# Patient Record
Sex: Male | Born: 1935
Health system: Southern US, Community
[De-identification: ages and names within clinical notes are randomized; demographics above are authoritative.]

## PROBLEM LIST (undated history)

## (undated) DIAGNOSIS — N390 Urinary tract infection, site not specified: Secondary | ICD-10-CM

## (undated) DIAGNOSIS — Z8669 Personal history of other diseases of the nervous system and sense organs: Secondary | ICD-10-CM

## (undated) DIAGNOSIS — Z87442 Personal history of urinary calculi: Secondary | ICD-10-CM

## (undated) DIAGNOSIS — N200 Calculus of kidney: Secondary | ICD-10-CM

## (undated) DIAGNOSIS — G510 Bell's palsy: Secondary | ICD-10-CM

## (undated) HISTORY — DX: Urinary tract infection, site not specified: N39.0

## (undated) HISTORY — DX: Personal history of urinary calculi: Z87.442

## (undated) HISTORY — PX: TONSILLECTOMY: SUR1361

## (undated) HISTORY — DX: Personal history of other diseases of the nervous system and sense organs: Z86.69

---

## 2003-06-15 ENCOUNTER — Emergency Department (HOSPITAL_COMMUNITY): Admission: AD | Admit: 2003-06-15 | Discharge: 2003-06-15 | Payer: Self-pay | Admitting: Family Medicine

## 2004-06-08 ENCOUNTER — Emergency Department (HOSPITAL_COMMUNITY): Admission: EM | Admit: 2004-06-08 | Discharge: 2004-06-09 | Payer: Self-pay | Admitting: Emergency Medicine

## 2014-11-22 ENCOUNTER — Encounter (HOSPITAL_COMMUNITY): Payer: Self-pay

## 2014-11-22 ENCOUNTER — Emergency Department (HOSPITAL_COMMUNITY)
Admission: EM | Admit: 2014-11-22 | Discharge: 2014-11-22 | Disposition: A | Discharge status: Home / Self Care | Payer: Medicare Other | Attending: Emergency Medicine | Admitting: Emergency Medicine

## 2014-11-22 DIAGNOSIS — Y9289 Other specified places as the place of occurrence of the external cause: Secondary | ICD-10-CM | POA: Diagnosis not present

## 2014-11-22 DIAGNOSIS — Y9389 Activity, other specified: Secondary | ICD-10-CM | POA: Insufficient documentation

## 2014-11-22 DIAGNOSIS — Y998 Other external cause status: Secondary | ICD-10-CM | POA: Insufficient documentation

## 2014-11-22 DIAGNOSIS — W57XXXA Bitten or stung by nonvenomous insect and other nonvenomous arthropods, initial encounter: Secondary | ICD-10-CM | POA: Insufficient documentation

## 2014-11-22 DIAGNOSIS — S40869A Insect bite (nonvenomous) of unspecified upper arm, initial encounter: Secondary | ICD-10-CM | POA: Diagnosis not present

## 2014-11-22 DIAGNOSIS — S40861A Insect bite (nonvenomous) of right upper arm, initial encounter: Secondary | ICD-10-CM | POA: Diagnosis not present

## 2014-11-22 MED ORDER — DOXYCYCLINE HYCLATE 100 MG PO TABS
200.0000 mg | ORAL_TABLET | Freq: Once | ORAL | Status: AC
  Administered 2014-11-22: 200 mg via ORAL
  Filled 2014-11-22: qty 2

## 2014-11-22 NOTE — ED Notes (Signed)
Pt bit in left AC by tick on Saturday, denies any symptoms regarding tick bite just has swelling and redness at site and needs to be checked.

## 2014-11-22 NOTE — ED Notes (Signed)
Pt reports he removed the Tick on Friday skin surrounding the bite site is red and swollen.

## 2014-11-22 NOTE — ED Provider Notes (Signed)
CSN: [id number]     Arrival date & time 11/22/14  1107 History  This chart was scribed for non-physician practitioner, Joycie PeekBenjamin Tiea Manninen, PA-C, working with Zadie Rhineonald Wickline, MD by Charline BillsEssence Howell, ED Scribe. This patient was seen in room TR07C/TR07C and the patient's care was started at 11:32 AM.   Chief Complaint  Patient presents with  . Tick Removal   The history is provided by the patient. No language interpreter was used.   HPI Comments: David [name] is a 79 y.o. male, with no pertinent medical history, who presents to the Emergency Department complaining of a tick bite to his R arm that occurred 3 days ago. Pt was able to remove the small tick that he suspects was not on him a full day. Reports tick was small and did not appear engorged. He reports surrounding redness, itching and swelling to his R arm. He denies associated pain, fever, body aches, chest pain, SOB, nausea, vomiting, rash. Pt has been treating with topical Benadryl with temporary relief. No other alleviating or aggravating factors. No known allergies.   History reviewed. No pertinent past medical history. History reviewed. No pertinent past surgical history. History reviewed. No pertinent family history. History  Substance Use Topics  . Smoking status: Never Smoker   . Smokeless tobacco: Not on file  . Alcohol Use: No    Review of Systems  Constitutional: Negative for fever.  Respiratory: Negative for shortness of breath.   Cardiovascular: Negative for chest pain.  Gastrointestinal: Negative for nausea and vomiting.  Musculoskeletal: Negative for myalgias.  Skin: Positive for color change.       + Bite   Allergies  Review of patient's allergies indicates no known allergies.  Home Medications   Prior to Admission medications   Not on File   BP 153/81 mmHg  Pulse 67  Temp(Src) 98.5 F (36.9 C) (Oral)  Resp 18  Ht 5\' 9"  (1.753 m)  Wt 150 lb (68.04 kg)  BMI 22.14 kg/m2  SpO2 98% Physical Exam   Constitutional: He is oriented to person, place, and time. He appears well-developed and well-nourished. No distress.  HENT:  Head: Normocephalic and atraumatic.  Eyes: Conjunctivae and EOM are normal.  Neck: Neck supple. No tracheal deviation present.  Cardiovascular: Normal rate, regular rhythm, normal heart sounds and intact distal pulses.   Pulmonary/Chest: Effort normal. No respiratory distress.  Abdominal: There is no tenderness.  Musculoskeletal: Normal range of motion.  Neurological: He is alert and oriented to person, place, and time.  Skin: Skin is warm and dry.  Small lesion, roughly 2 mm, with area of erythema to R AC, consistent with insect bite. Mild edema and induration surrounding lesion. No evidence of retained foreign body.  Psychiatric: He has a normal mood and affect. His behavior is normal.  Nursing note and vitals reviewed.  ED Course  Procedures (including critical care time) DIAGNOSTIC STUDIES: Oxygen Saturation is 98% on RA, normal by my interpretation.    COORDINATION OF CARE: 11:38 AM-Discussed treatment plan which includes Doxycycline with pt at bedside and pt agreed to plan.   Labs Review Labs Reviewed - No data to display  Imaging Review No results found.   EKG Interpretation None     Meds given in ED:  Medications  doxycycline (VIBRA-TABS) tablet 200 mg (200 mg Oral Given 11/22/14 1144)    New Prescriptions   No medications on file   Filed Vitals:   11/22/14 1118 11/22/14 1126  BP: 153/81   Pulse: 67  Temp: 98.5 F (36.9 C)   TempSrc: Oral   Resp: 18   Height:   (1.753 m)  Weight:  150 lb (68.04 kg)  SpO2: 98%     MDM  Vitals stable - WNL -afebrile Pt resting comfortably in ED. PE--erythema and local irritation to right AC.  DDX--due to patient's age and local irritation to arm suspected to be caused by tick bite, we'll treat empirically with 200 mg doxycycline as one dose given in ED. No evidence of other acute or  emergent pathology at this time.  I discussed all relevant lab findings and imaging results with pt and they verbalized understanding. Discussed f/u with PCP within 48 hrs and return precautions, pt very amenable to plan. Patient stable, in good condition and is appropriate for discharge  Final diagnoses:  Tick bite   I personally performed the services described in this documentation, which was scribed in my presence. The recorded information has been reviewed and is accurate.    Joycie Peek, PA-C 11/22/14 1147  Zadie Rhine, MD 11/22/14 1556

## 2014-11-22 NOTE — ED Notes (Signed)
Declined W/C at D/C and was escorted to lobby by RN. 

## 2014-11-22 NOTE — Discharge Instructions (Signed)
Lyme Disease You may have been bitten by a tick and are to watch for the development of Lyme Disease. Lyme Disease is an infection that is caused by a bacteria The bacteria causing this disease is named Borreilia burgdorferi. If a tick is infected with this bacteria and then bites you, then Lyme Disease may occur. These ticks are carried by deer and rodents such as rabbits and mice and infest grassy as well as forested areas. Fortunately most tick bites do not cause Lyme Disease.  Lyme Disease is easier to prevent than to treat. First, covering your legs with clothing when walking in areas where ticks are possibly abundant will prevent their attachment because ticks tend to stay within inches of the ground. Second, using insecticides containing DEET can be applied on skin or clothing. Last, because it takes about 12 to 24 hours for the tick to transmit the disease after attachment to the human host, you should inspect your body for ticks twice a day when you are in areas where Lyme Disease is common. You must look thoroughly when searching for ticks. The Ixodes tick that carries Lyme Disease is very small. It is around the size of a sesame seed (picture of tick is not actual size). Removal is best done by grasping the tick by the head and pulling it out. Do not to squeeze the body of the tick. This could inject the infecting bacteria into the bite site. Wash the area of the bite with an antiseptic solution after removal.  Lyme Disease is a disease that may affect many body systems. Because of the small size of the biting tick, most people do not notice being bitten. The first sign of an infection is usually a round red rash that extends out from the center of the tick bite. The center of the lesion may be blood colored (hemorrhagic) or have tiny blisters (vesicular). Most lesions have bright red outer borders and partial central clearing. This rash may extend out many inches in diameter, and multiple lesions may  be present. Other symptoms such as fatigue, headaches, chills and fever, general achiness and swelling of lymph glands may also occur. If this first stage of the disease is left untreated, these symptoms may gradually resolve by themselves, or progressive symptoms may occur because of spread of infection to other areas of the body.  Follow up with your caregiver to have testing and treatment if you have a tick bite and you develop any of the above complaints. Your caregiver may recommend preventative (prophylactic) medications which kill bacteria (antibiotics). Once a diagnosis of Lyme Disease is made, antibiotic treatment is highly likely to cure the disease. Effective treatment of late stage Lyme Disease may require longer courses of antibiotic therapy.  MAKE SURE YOU:   Understand these instructions.  Will watch your condition.  Will get help right away if you are not doing well or get worse. Document Released: 11/19/2000 Document Revised: 11/05/2011 Document Reviewed: 01/21/2009 [facility] Patient Information 2015 [place], [place]. This information is not intended to replace advice given to you by your health care provider. Make sure you discuss any questions you have with your health care provider.   Your evaluated in the ED today following her tick bite. You were given prophylactic antibiotic treatment with doxycycline. If you begin to experience fevers, increased skin rash, sore muscles, nausea or vomiting your need to return to ED for further evaluation management of your symptoms. Please follow-up with [facility] and wellness in order  to establish care and for further management of your symptoms.

## 2015-09-19 ENCOUNTER — Encounter: Payer: Self-pay | Admitting: Podiatry

## 2015-09-19 ENCOUNTER — Ambulatory Visit: Payer: Medicare Other

## 2015-09-19 ENCOUNTER — Ambulatory Visit (INDEPENDENT_AMBULATORY_CARE_PROVIDER_SITE_OTHER): Payer: Medicare Other | Admitting: Podiatry

## 2015-09-19 VITALS — BP 164/83 | HR 67 | Resp 16 | Ht 69.0 in | Wt 150.0 lb

## 2015-09-19 DIAGNOSIS — M779 Enthesopathy, unspecified: Secondary | ICD-10-CM

## 2015-09-19 DIAGNOSIS — L600 Ingrowing nail: Secondary | ICD-10-CM

## 2015-09-19 NOTE — Patient Instructions (Signed)

## 2015-09-19 NOTE — Progress Notes (Signed)
   Subjective:    Patient ID: David [name], male    DOB: 14-Nov-1935, 80 y.o.   MRN: [id number]  HPI Patient presents with a nail problem in their left foot, great toe; nail discoloration; nail growing wrong way. Pt stated, "had nail removed 2 yrs ago at [facility]; nail not growing back properly".  Pt also stated, "was told the nail would not grow back".   Review of Systems  Hematological: Bruises/bleeds easily.  All other systems reviewed and are negative.      Objective:   Physical Exam        Assessment & Plan:

## 2015-09-20 NOTE — Progress Notes (Signed)
Subjective:     Patient ID: David [name], male   DOB: 1936/04/18, 80 y.o.   MRN: [id number]  HPI patient presents stating I got some discomfort in the forefoot left and I have this damaged big toenail left that was removed and part of it has regrown and is impossible for me to cut and can become painful   Review of Systems  All other systems reviewed and are negative.      Objective:   Physical Exam  Constitutional: He is oriented to person, place, and time.  Cardiovascular: Intact distal pulses.   Musculoskeletal: Normal range of motion.  Neurological: He is oriented to person, place, and time.  Skin: Skin is warm and dry.  Nursing note and vitals reviewed.  neurovascular status was found to be intact with muscle strength adequate range of motion within normal limits and patient noted to have a damaged hallux nail left with thickness of the medial side and discomfort when pressed dorsally. I also checked the joint and found there to be mild inflammation localized to the second metatarsophalangeal joint left     Assessment:     Damaged hallux nail left with pain and also probable mild capsulitis    Plan:     H&P conditions reviewed and discussed treatment options. I recommended nail removal of the remaining part and explained procedure and risk and patient wants the surgery. Today I infiltrated 60 mg Xylocaine Marcaine mixture remove the medial side of the hallux nail that was left exposed matrix and applied phenol 3 applications 30 seconds followed by alcohol lavage and sterile dressing. Gave instructions on soaks and reappoint

## 2015-10-10 ENCOUNTER — Encounter: Payer: Self-pay | Admitting: Podiatry

## 2015-10-10 ENCOUNTER — Ambulatory Visit (INDEPENDENT_AMBULATORY_CARE_PROVIDER_SITE_OTHER): Payer: Medicare Other | Admitting: Podiatry

## 2015-10-10 VITALS — BP 145/72 | HR 72 | Resp 12

## 2015-10-10 DIAGNOSIS — L600 Ingrowing nail: Secondary | ICD-10-CM | POA: Diagnosis not present

## 2015-10-10 DIAGNOSIS — M779 Enthesopathy, unspecified: Secondary | ICD-10-CM

## 2015-10-11 NOTE — Progress Notes (Signed)
Subjective:     Patient ID: David [name], male   DOB: [birth year], 80 y.o.   MRN: [id number]  HPI patient states my left big toenail is improved but I wanted to check and my foot feels some better from previous   Review of Systems     Objective:   Physical Exam Neurovascular status intact with crusted tissue on the dorsum left hallux that's localized in nature with no other pathology and mild inflammation around the metatarsal phalangeal joints bilateral that has not progressed    Assessment:     Improved hallux nail structure left and inflammatory capsulitis bilateral    Plan:     Advised on physical therapy and shoe gear modifications and continue soaking until all crossed has left the big toenail

## 2016-05-18 ENCOUNTER — Encounter: Payer: Self-pay | Admitting: Podiatry

## 2016-05-18 NOTE — Progress Notes (Signed)
Medical Records Mailed to [employer]

## 2016-12-10 DIAGNOSIS — H35433 Paving stone degeneration of retina, bilateral: Secondary | ICD-10-CM | POA: Diagnosis not present

## 2016-12-10 DIAGNOSIS — H33321 Round hole, right eye: Secondary | ICD-10-CM | POA: Diagnosis not present

## 2016-12-10 DIAGNOSIS — H35373 Puckering of macula, bilateral: Secondary | ICD-10-CM | POA: Diagnosis not present

## 2016-12-10 DIAGNOSIS — H43813 Vitreous degeneration, bilateral: Secondary | ICD-10-CM | POA: Diagnosis not present

## 2017-02-01 DIAGNOSIS — H25043 Posterior subcapsular polar age-related cataract, bilateral: Secondary | ICD-10-CM | POA: Diagnosis not present

## 2017-02-01 DIAGNOSIS — H2512 Age-related nuclear cataract, left eye: Secondary | ICD-10-CM | POA: Diagnosis not present

## 2017-02-01 DIAGNOSIS — H2513 Age-related nuclear cataract, bilateral: Secondary | ICD-10-CM | POA: Diagnosis not present

## 2017-02-01 DIAGNOSIS — H35373 Puckering of macula, bilateral: Secondary | ICD-10-CM | POA: Diagnosis not present

## 2017-02-01 DIAGNOSIS — H25013 Cortical age-related cataract, bilateral: Secondary | ICD-10-CM | POA: Diagnosis not present

## 2017-03-04 DIAGNOSIS — H2512 Age-related nuclear cataract, left eye: Secondary | ICD-10-CM | POA: Diagnosis not present

## 2017-03-05 DIAGNOSIS — H2511 Age-related nuclear cataract, right eye: Secondary | ICD-10-CM | POA: Diagnosis not present

## 2017-03-25 DIAGNOSIS — H2511 Age-related nuclear cataract, right eye: Secondary | ICD-10-CM | POA: Diagnosis not present

## 2017-05-13 DIAGNOSIS — H26492 Other secondary cataract, left eye: Secondary | ICD-10-CM | POA: Diagnosis not present

## 2017-05-13 DIAGNOSIS — H35372 Puckering of macula, left eye: Secondary | ICD-10-CM | POA: Diagnosis not present

## 2018-07-21 DIAGNOSIS — M79672 Pain in left foot: Secondary | ICD-10-CM | POA: Diagnosis not present

## 2018-08-04 DIAGNOSIS — M79672 Pain in left foot: Secondary | ICD-10-CM | POA: Diagnosis not present

## 2018-09-17 ENCOUNTER — Telehealth: Payer: Self-pay

## 2018-09-17 NOTE — Telephone Encounter (Signed)
Called pt to advise of the need for awv. LVM KH

## 2019-10-31 DIAGNOSIS — J0100 Acute maxillary sinusitis, unspecified: Secondary | ICD-10-CM | POA: Diagnosis not present

## 2019-11-07 DIAGNOSIS — J0100 Acute maxillary sinusitis, unspecified: Secondary | ICD-10-CM | POA: Diagnosis not present

## 2019-11-08 ENCOUNTER — Emergency Department (HOSPITAL_COMMUNITY): Payer: Medicare Other

## 2019-11-08 ENCOUNTER — Emergency Department (HOSPITAL_COMMUNITY)
Admission: EM | Admit: 2019-11-08 | Discharge: 2019-11-08 | Disposition: A | Discharge status: Home / Self Care | Payer: Medicare Other | Attending: Emergency Medicine | Admitting: Emergency Medicine

## 2019-11-08 ENCOUNTER — Other Ambulatory Visit: Payer: Self-pay

## 2019-11-08 ENCOUNTER — Encounter (HOSPITAL_COMMUNITY): Payer: Self-pay | Admitting: Emergency Medicine

## 2019-11-08 DIAGNOSIS — E860 Dehydration: Secondary | ICD-10-CM | POA: Diagnosis not present

## 2019-11-08 DIAGNOSIS — E878 Other disorders of electrolyte and fluid balance, not elsewhere classified: Secondary | ICD-10-CM

## 2019-11-08 DIAGNOSIS — N390 Urinary tract infection, site not specified: Secondary | ICD-10-CM

## 2019-11-08 DIAGNOSIS — R0981 Nasal congestion: Secondary | ICD-10-CM

## 2019-11-08 DIAGNOSIS — R197 Diarrhea, unspecified: Secondary | ICD-10-CM | POA: Insufficient documentation

## 2019-11-08 DIAGNOSIS — R531 Weakness: Secondary | ICD-10-CM | POA: Diagnosis not present

## 2019-11-08 LAB — URINALYSIS, ROUTINE W REFLEX MICROSCOPIC
Bacteria, UA: NONE SEEN
Bilirubin Urine: NEGATIVE
Glucose, UA: NEGATIVE mg/dL
Ketones, ur: 80 mg/dL — AB
Leukocytes,Ua: NEGATIVE
Nitrite: NEGATIVE
Protein, ur: 100 mg/dL — AB
Specific Gravity, Urine: 1.028 (ref 1.005–1.030)
pH: 6 (ref 5.0–8.0)

## 2019-11-08 LAB — BASIC METABOLIC PANEL
Anion gap: 13 (ref 5–15)
BUN: 30 mg/dL — ABNORMAL HIGH (ref 8–23)
CO2: 24 mmol/L (ref 22–32)
Calcium: 8.8 mg/dL — ABNORMAL LOW (ref 8.9–10.3)
Chloride: 110 mmol/L (ref 98–111)
Creatinine, Ser: 0.81 mg/dL (ref 0.61–1.24)
GFR calc Af Amer: >60 mL/min (ref >60)
GFR calc non Af Amer: >60 mL/min (ref >60)
Glucose, Bld: 110 mg/dL — ABNORMAL HIGH (ref 70–99)
Potassium: 3.4 mmol/L — ABNORMAL LOW (ref 3.5–5.1)
Sodium: 147 mmol/L — ABNORMAL HIGH (ref 135–145)

## 2019-11-08 LAB — CBC
HCT: 46.4 % (ref 39.0–52.0)
Hemoglobin: 15.1 g/dL (ref 13.0–17.0)
MCH: 28.9 pg (ref 26.0–34.0)
MCHC: 32.5 g/dL (ref 30.0–36.0)
MCV: 88.9 fL (ref 80.0–100.0)
Platelets: 247 10*3/uL (ref 150–400)
RBC: 5.22 MIL/uL (ref 4.22–5.81)
RDW: 13.6 % (ref 11.5–15.5)
WBC: 7.1 10*3/uL (ref 4.0–10.5)
nRBC: 0 % (ref 0.0–0.2)

## 2019-11-08 MED ORDER — CETIRIZINE HCL 10 MG PO TABS: 10.0000 mg | ORAL_TABLET | Freq: Every day | ORAL | 0 refills | Status: DC

## 2019-11-08 MED ORDER — SODIUM CHLORIDE 0.9 % IV BOLUS
500.0000 mL | Freq: Once | INTRAVENOUS | Status: AC
  Administered 2019-11-08: 17:00:00 500 mL via INTRAVENOUS

## 2019-11-08 MED ORDER — FLUTICASONE PROPIONATE 50 MCG/ACT NA SUSP: 1.0000 | Freq: Every day | NASAL | 2 refills | Status: DC

## 2019-11-08 MED ORDER — SODIUM CHLORIDE 0.9 % IV SOLN
1.0000 g | Freq: Once | INTRAVENOUS | Status: AC
  Administered 2019-11-08: 17:00:00 1 g via INTRAVENOUS
  Filled 2019-11-08: qty 10

## 2019-11-08 MED ORDER — SODIUM CHLORIDE 0.9% FLUSH: 3.0000 mL | Freq: Once | INTRAVENOUS | Status: DC

## 2019-11-08 MED ORDER — SODIUM CHLORIDE 0.9 % IV BOLUS
500.0000 mL | Freq: Once | INTRAVENOUS | Status: AC
  Administered 2019-11-08: 14:00:00 500 mL via INTRAVENOUS

## 2019-11-08 MED ORDER — CEPHALEXIN 500 MG PO CAPS: 500.0000 mg | ORAL_CAPSULE | Freq: Four times a day (QID) | ORAL | 0 refills | Status: AC

## 2019-11-08 NOTE — ED Triage Notes (Addendum)
C/o generalized weakness. States he has been unable to eat or drink for 2-3 days.  States he has loss his taste and smell.  Reports diarrhea x 2 days but none today.  Pt states he went to a Dr on [address] and took a medication for a few days (not sure what) and stopped it yesterday.  States he was started on a steroid this morning but unable to tell me why.

## 2019-11-08 NOTE — ED Notes (Signed)
Patient verbalizes understanding of discharge instructions. Opportunity for questioning and answers were provided. Armband removed by staff, pt discharged from ED.  

## 2019-11-08 NOTE — ED Provider Notes (Signed)
[facility] EMERGENCY DEPARTMENT Provider Note   CSN: [id number] Arrival date & time: 11/08/19  1246     History Chief Complaint  Patient presents with  . Diarrhea  . Weakness    David [name] is a 84 y.o. male.  84 year old male with no significant past medical history brought in by his daughter with complaint of sinus congestion and decreased p.o. intake.  History provided by daughter as patient states he just does not feel good and asked her for further details.  Daughter states patient developed sinus congestion about a week ago, went to an urgent care and was diagnosed with sinusitis treated with amoxicillin.  Patient developed diarrhea, was not eating or drinking and did not seem to be getting better in regards to his sinus congestion so he returned to the urgent care where he had a negative Covid test yesterday as well as a negative strep test and was told to stop the amoxicillin was prescribed steroids and a Z-Pak.  Patient has been compliant with his medications, states that he does not want to eat anything because food just does not taste good.  Denies cough, shortness of breath or chest pain, no known sick contacts.  No other complaints or concerns today.        History reviewed. No pertinent past medical history.  There are no problems to display for this patient.   History reviewed. No pertinent surgical history.     No family history on file.  Social History   Tobacco Use  . Smoking status: Never Smoker  Substance Use Topics  . Alcohol use: No  . Drug use: No    Home Medications Prior to Admission medications   Medication Sig Start Date End Date Taking? Authorizing Provider  azithromycin (ZITHROMAX) 250 MG tablet Take 250 mg by mouth See admin instructions. Take two tablets by mouth on day 1, then take 1 tablet by mouth for the next four days   Yes [provider]  predniSONE (DELTASONE) 5 MG tablet Take 5 mg by mouth See  admin instructions. Prednisone dose pack 21. Day 1: Take two tablets by mouth before breakfast, then take 1 at lunch, then take 1 at supper, then take two at bedtime. Day 2: 1 tablet by mouth before breakfast, then take 1 tablet after lunch then take 1 tablet after supper, then two tablets at bedtime. Day 3: take 1 tablet by mouth before breakfast then take 1 at lunch, then take one tablat after supper, 1 tablet at bedtime. Day 4: Take 1 tablet by mouth before breakfast then take 1 after lunch then 1 tablet at bedtime, Day 5: 1 tablet by mouth before breakfast then take 1 tablet by mouth at bedtime. Day 6 take one tablet by mouth before breakfast   Yes [provider]  cetirizine (ZYRTEC ALLERGY) 10 MG tablet Take 1 tablet (10 mg total) by mouth daily. 11/08/19 12/08/19  Jeannie Fend, PA-C  fluticasone (FLONASE) 50 MCG/ACT nasal spray Place 1 spray into both nostrils daily. 11/08/19   Jeannie Fend, PA-C    Allergies    Patient has no known allergies.  Review of Systems   Review of Systems  Constitutional: Negative for chills and fever.  HENT: Positive for congestion.   Respiratory: Negative for cough and shortness of breath.   Cardiovascular: Negative for chest pain.  Gastrointestinal: Positive for diarrhea. Negative for abdominal pain, constipation, nausea and vomiting.  Genitourinary: Positive for decreased urine volume.  Musculoskeletal: Negative for arthralgias and myalgias.  Skin: Negative for rash and wound.  Allergic/Immunologic: Negative for immunocompromised state.  Neurological: Negative for weakness.  Hematological: Negative for adenopathy.  Psychiatric/Behavioral: Negative for confusion.  All other systems reviewed and are negative.   Physical Exam Updated Vital Signs BP (!) 158/92 (BP Location: Right Arm)   Pulse 80   Temp 98.2 F (36.8 C) (Oral)   Resp 14   SpO2 100%   Physical Exam Vitals and nursing note reviewed.  Constitutional:      General: He is  not in acute distress.    Appearance: He is well-developed. He is not diaphoretic.     Comments: thin  HENT:     Head: Normocephalic and atraumatic.     Mouth/Throat:     Mouth: Mucous membranes are dry.  Cardiovascular:     Rate and Rhythm: Normal rate and regular rhythm.     Pulses: Normal pulses.     Heart sounds: Normal heart sounds.  Pulmonary:     Effort: Pulmonary effort is normal.     Breath sounds: Normal breath sounds.  Chest:    Abdominal:     Palpations: Abdomen is soft.     Tenderness: There is no abdominal tenderness.  Musculoskeletal:     Right lower leg: No edema.     Left lower leg: No edema.  Skin:    General: Skin is warm and dry.     Findings: No erythema or rash.  Neurological:     Mental Status: He is alert and oriented to person, place, and time.  Psychiatric:        Behavior: Behavior normal.     ED Results / Procedures / Treatments   Labs (all labs ordered are listed, but only abnormal results are displayed) Labs Reviewed  BASIC METABOLIC PANEL - Abnormal; Notable for the following components:      Result Value   Sodium 147 (*)    Potassium 3.4 (*)    Glucose, Bld 110 (*)    BUN 30 (*)    Calcium 8.8 (*)    All other components within normal limits  CBC  URINALYSIS, ROUTINE W REFLEX MICROSCOPIC    EKG EKG Interpretation  Date/Time:  Sunday November 08 2019 13:14:08 EDT Ventricular Rate:  88 PR Interval:    QRS Duration: 134 QT Interval:  402 QTC Calculation: 486 R Axis:   94 Text Interpretation: Sinus rhythm with short PR Right bundle branch block Abnormal ECG No old tracing to compare Confirmed by Noemi Chapel [phone]) on 11/08/2019 1:27:32 PM   Radiology DG Chest 2 View  Result Date: 11/08/2019 CLINICAL DATA:  Generalized weakness. EXAM: CHEST - 2 VIEW COMPARISON:  None. FINDINGS: The lungs are hyperinflated. There is no evidence of acute infiltrate, pleural effusion or pneumothorax. The heart size and mediastinal contours are  within normal limits. There is tortuosity of the descending thoracic aorta. The visualized skeletal structures are unremarkable. IMPRESSION: No active cardiopulmonary disease. Electronically Signed   By: Virgina Norfolk M.D.   On: 11/08/2019 15:09    Procedures Procedures (including critical care time)  Medications Ordered in ED Medications  sodium chloride flush (NS) 0.9 % injection 3 mL (has no administration in time range)  sodium chloride 0.9 % bolus 500 mL (500 mLs Intravenous New Bag/Given 11/08/19 1411)    ED Course  I have reviewed the triage vital signs and the nursing notes.  Pertinent labs & imaging results that were available during my  care of the patient were reviewed by me and considered in my medical decision making (see chart for details).  Clinical Course as of Nov 07 1532  Sun Nov 08, 2019  1458 84yo male dropped off by daughter for evaluation, phone call to daughter, concerned for dehydration as patient is not eating or drinking lately. Patient states this is due to food not tasting good, he also tells me he doesn't have an appetite.  On exam, patient is thin, dry mucous membranes, abdomen is soft and non tender, lungs clear, no lower extremity edema.  Question possible COVID due to loss/lack of sense of taste. Possible seasonal allergy vs viral cause of his congestion, consider repeat COVID test (rapid test negative yesterday), dc with Flonase and Zyrtec if CXR and UA normal.   [LM]    Clinical Course User Index [LM] Alden Hipp   MDM Rules/Calculators/A&P                      Final Clinical Impression(s) / ED Diagnoses Final diagnoses:  Electrolyte disturbance  Sinus congestion    Rx / DC Orders ED Discharge Orders         Ordered    fluticasone (FLONASE) 50 MCG/ACT nasal spray  Daily     11/08/19 1517    cetirizine (ZYRTEC ALLERGY) 10 MG tablet  Daily     11/08/19 1517           Jeannie Fend, PA-C 11/08/19 1535    Eber Hong,  MD 11/08/19 1951

## 2019-11-08 NOTE — ED Provider Notes (Signed)
Pt's care assumed by me at 3pm.  Ua shows 21-50 wbc's.  Pt given Iv ns x 1 liter.  Pt drinking fluids.  Rx changed from zithromax to keflex to cover for uti.     David [name], [place] 11/08/19 1754    David Hong, MD 11/10/19 1525

## 2019-11-08 NOTE — Discharge Instructions (Addendum)
Use Flonase and Zyrtec daily as prescribed. COVID test sent out today due to complaint of food not tasting good.  Follow up with a primary care provider, return to the ER for new or worsening symptoms.

## 2019-11-08 NOTE — ED Notes (Signed)
Pt's daughter updated with patient's discharge instructions and follow up information.

## 2019-11-09 LAB — URINE CULTURE: Culture: NO GROWTH

## 2019-11-18 DIAGNOSIS — N390 Urinary tract infection, site not specified: Secondary | ICD-10-CM | POA: Diagnosis not present

## 2020-01-08 ENCOUNTER — Ambulatory Visit (INDEPENDENT_AMBULATORY_CARE_PROVIDER_SITE_OTHER): Payer: Medicare Other | Admitting: Family Medicine

## 2020-01-08 ENCOUNTER — Encounter: Payer: Self-pay | Admitting: Family Medicine

## 2020-01-08 ENCOUNTER — Other Ambulatory Visit: Payer: Self-pay

## 2020-01-08 VITALS — BP 120/72 | HR 61 | Temp 97.7°F | Ht 65.75 in | Wt 135.2 lb

## 2020-01-08 DIAGNOSIS — E876 Hypokalemia: Secondary | ICD-10-CM | POA: Diagnosis not present

## 2020-01-08 DIAGNOSIS — R634 Abnormal weight loss: Secondary | ICD-10-CM

## 2020-01-08 NOTE — Progress Notes (Signed)
   Subjective:    Patient ID: David [name], male    DOB: 14-Aug-1936, 84 y.o.   MRN: [id number]  HPI Chief Complaint  Patient presents with  . new pt    new pt get established. no concerns   He is new to the practice and here to establish care.  Previous medical care: No regular medical care in many years. States he is healthy as far as he knows.  His daughter is with him today.  Patient states his daughter really wanted him to establish with a primary care provider and that is why he is here today.  Recent sinus infection and went to the ED. he was also found to be dehydrated and hypokalemic. States he has fully recovered.  States he has lost 15 pounds over the past couple of years.  Reports having a normal appetite and eating "plenty throughout the day" His daughter reports that he seems to be losing more weight recently and that his close are looser than usual.  Denies fever, chills, night sweats, dizziness, headache, chest pain, palpitations, shortness of breath, orthopnea, abdominal pain, N/V/D, urinary symptoms, LE edema.    Social history: Lives alone.  He is a widower.  retired at age 47.  He was in the [employer]  Former smoker and stopped in 1964.   Reviewed allergies, medications, past medical, surgical, family, and social history.     Review of Systems  Pertinent positives and negatives in the history of present illness.     Objective:   Physical Exam BP 120/72   Pulse 61   Temp 97.7 F (36.5 C)   Ht 5' 5.75" (1.67 m)   Wt 135 lb 3.2 oz (61.3 kg)   SpO2 98%   BMI 21.99 kg/m   Alert and in no distress. Neck is supple without adenopathy or thyromegaly. Cardiac exam shows a regular rhythm without murmurs or gallops. Lungs are clear to auscultation.  Extremities without edema.  Skin is warm and dry.  Normal speech, mood, memory, and thought process.      Assessment & Plan:  Unintentional weight loss - Plan: CBC with Differential/Platelet, Comprehensive  metabolic panel, TSH, T4, free  Hypokalemia - Plan: Comprehensive metabolic panel  He is a pleasant 84 year old male who is new to the practice and here today with his daughter to establish care with me.  He lives alone and is able to take care of his ADLs.  His concern today is recent weight loss.  Does not appear that he has had any preventive healthcare in many years.  He is not sure if he wants to have any cancer screening or not.  He will think about this. I will check labs today and have him return for a wellness visit.  He agrees to do so.

## 2020-01-08 NOTE — Patient Instructions (Signed)
Let me know if you are continuing to lose weight or if you have any changes in appetite, energy level or any other new symptoms.   I will see you back for your medicare wellness visit.

## 2020-01-09 LAB — COMPREHENSIVE METABOLIC PANEL
ALT: 38 IU/L (ref 0–44)
AST: 37 IU/L (ref 0–40)
Albumin/Globulin Ratio: 1.9 (ref 1.2–2.2)
Albumin: 4.2 g/dL (ref 3.6–4.6)
Alkaline Phosphatase: 66 IU/L (ref 39–117)
BUN/Creatinine Ratio: 21 (ref 10–24)
BUN: 20 mg/dL (ref 8–27)
Bilirubin Total: 0.4 mg/dL (ref 0.0–1.2)
CO2: 25 mmol/L (ref 20–29)
Calcium: 9.5 mg/dL (ref 8.6–10.2)
Chloride: 104 mmol/L (ref 96–106)
Creatinine, Ser: 0.94 mg/dL (ref 0.76–1.27)
GFR calc Af Amer: 87 mL/min/{1.73_m2} (ref >59)
GFR calc non Af Amer: 75 mL/min/{1.73_m2} (ref >59)
Globulin, Total: 2.2 g/dL (ref 1.5–4.5)
Glucose: 86 mg/dL (ref 65–99)
Potassium: 5.2 mmol/L (ref 3.5–5.2)
Sodium: 141 mmol/L (ref 134–144)
Total Protein: 6.4 g/dL (ref 6.0–8.5)

## 2020-01-09 LAB — CBC WITH DIFFERENTIAL/PLATELET
Basophils Absolute: 0.1 10*3/uL (ref 0.0–0.2)
Basos: 1 %
EOS (ABSOLUTE): 0.2 10*3/uL (ref 0.0–0.4)
Eos: 3 %
Hematocrit: 46.3 % (ref 37.5–51.0)
Hemoglobin: 15.4 g/dL (ref 13.0–17.7)
Immature Grans (Abs): 0 10*3/uL (ref 0.0–0.1)
Immature Granulocytes: 0 %
Lymphocytes Absolute: 1.3 10*3/uL (ref 0.7–3.1)
Lymphs: 22 %
MCH: 30.3 pg (ref 26.6–33.0)
MCHC: 33.3 g/dL (ref 31.5–35.7)
MCV: 91 fL (ref 79–97)
Monocytes Absolute: 0.5 10*3/uL (ref 0.1–0.9)
Monocytes: 8 %
Neutrophils Absolute: 3.9 10*3/uL (ref 1.4–7.0)
Neutrophils: 66 %
Platelets: 199 10*3/uL (ref 150–450)
RBC: 5.08 x10E6/uL (ref 4.14–5.80)
RDW: 15.2 % (ref 11.6–15.4)
WBC: 6 10*3/uL (ref 3.4–10.8)

## 2020-01-09 LAB — TSH: TSH: 2.99 u[IU]/mL (ref 0.450–4.500)

## 2020-01-09 LAB — T4, FREE: Free T4: 1.12 ng/dL (ref 0.82–1.77)

## 2020-01-10 NOTE — Progress Notes (Signed)
His labs are all normal including his blood counts, electrolytes, kidney, liver and thyroid functions.

## 2020-02-17 NOTE — Progress Notes (Signed)
David [name] is a 84 y.o. male who presents for annual wellness visit and follow-up on chronic medical conditions.  He has the following concerns:  No concerns.  Weight stable since his last visit 6 weeks ago.   Smoked while he was in the [employer] but stopped at age 68.   States he has some issues chewing and needs dental work. His daughter will call to schedule.   2 adult children and 1 adopted. Several grandchildren.   Immunizations- Moderna 01/13/2020, 02/10/2020   There is no immunization history on file for this patient. Last colonoscopy: never Last PSA:  never Dentist: years. States he needs dental work  Ophtho: 2-3 years ago  Exercise: no excerise   Other doctors caring for patient include: None   Depression screen:  See questionnaire below.     Depression screen [facility] 2/9 02/18/2020 01/08/2020  Decreased Interest 0 0  Down, Depressed, Hopeless 0 0  PHQ - 2 Score 0 0    Fall Screen: See Questionaire below.   Fall Risk  02/18/2020 01/08/2020  Falls in the past year? 0 0  Number falls in past yr: 0 0  Injury with Fall? 0 0    ADL screen:  See questionnaire below.  Functional Status Survey: Is the patient deaf or have difficulty hearing?: Yes (due to age) Does the patient have difficulty seeing, even when wearing glasses/contacts?: Yes (due to age) Does the patient have difficulty concentrating, remembering, or making decisions?: Yes (due to age) Does the patient have difficulty walking or climbing stairs?: No Does the patient have difficulty dressing or bathing?: No Does the patient have difficulty doing errands alone such as visiting a doctor's office or shopping?: No   End of Life Discussion:  Patient has a living will and medical power of attorney. His daughter David [name] is his HCPOA.     Review of Systems  Constitutional: -fever, -chills, -sweats, -unexpected weight change, -anorexia, -fatigue Allergy: -sneezing, -itching, -congestion Dermatology: denies  changing moles, rash, lumps, new worrisome lesions ENT: -runny nose, -ear pain, -sore throat, -hoarseness, -sinus pain, -teeth pain, -tinnitus, +hearing loss, -epistaxis Cardiology:  -chest pain, -palpitations, -edema, -orthopnea, -paroxysmal nocturnal dyspnea Respiratory: -cough, -shortness of breath, -dyspnea on exertion, -wheezing, -hemoptysis Gastroenterology: -abdominal pain, -nausea, -vomiting, -diarrhea, -constipation, -blood in stool, -changes in bowel movement, -dysphagia Hematology: -bleeding or bruising problems Musculoskeletal: -arthralgias, -myalgias, -joint swelling, -back pain, -neck pain, -cramping, -gait changes Ophthalmology: -vision changes, -eye redness, -itching, -discharge Urology: -dysuria, -difficulty urinating, -hematuria, -urinary frequency, -urgency, incontinence Neurology: -headache, -weakness, -tingling, -numbness, -speech abnormality, -memory loss, -falls, -dizziness Psychology:  -depressed mood, -agitation, -sleep problems   PHYSICAL EXAM:  BP 130/70   Pulse (!) 56   Ht 5\' 6"  (1.676 m)   Wt 135 lb 3.2 oz (61.3 kg)   BMI 21.82 kg/m   General Appearance: Alert, cooperative, no distress, appears stated age Head: Normocephalic, without obvious abnormality, atraumatic Eyes: PERRL, conjunctiva/corneas clear, EOM's intact Ears: Normal TM's and external ear canals Nose: Mask in place  Throat: mask in place  Neck: Supple, no lymphadenopathy, thyroid:no enlargement/tenderness/nodules; no JVD Back: Spine nontender, kyphosis, ROM normal, no CVA tenderness Lungs: Clear to auscultation bilaterally without wheezes, rales or ronchi; respirations unlabored Chest Wall: No tenderness or deformity Heart: Regular rate and rhythm, S1 and S2 normal, no murmur, rub or gallop Breast Exam: No chest wall tenderness, masses or gynecomastia Abdomen: Soft, non-tender, nondistended, normoactive bowel sounds, no masses, no hepatosplenomegaly Genitalia: not done  Extremities: No  clubbing,  cyanosis or edema Pulses: 2+ and symmetric all extremities Skin: Skin color, texture, turgor normal, no rashes or lesions Lymph nodes: Cervical, supraclavicular, and axillary nodes normal Neurologic: CNII-XII intact, normal strength, sensation and gait; reflexes 2+ and symmetric throughout   Psych: Normal mood, affect, hygiene and grooming  ASSESSMENT/PLAN: Medicare annual wellness visit, subsequent He is fairly new to the practice and here for a Medicare wellness visit.  He drove himself.  He lives alone and states he is functioning quite fine.  He is hard of hearing but declines referral to audiology or to get hearing aids.  His daughter will take him to the dentist so that he may get dental work done.  Denies falls or depression.  Denies memory concerns.  Denies any issues with ADLs.  States he is still mowing his lawn with a push mower. He has kids and grandchildren and states he has always has visitors.  Advanced directives discussed.  Forms were given for him to take home and discuss with his daughter who will be his healthcare power of attorney.  He declined filling out the MOST form and wants to take this home as well.   Routine general medical examination at a health care facility - Plan: POCT Urinalysis DIP (Proadvantage Device) -Here for a CPE.  He is nonfasting.  He declines lipid panel.  Reviewed recent labs with him which were all normal.  Preventive health care reviewed.  He either has aged out or declines cancer screenings.  Immunizations reviewed.  Discussed safety and health promotion.  Presbycusis of both ears -Declines referral to audiology.  States he does not want hearing aids.  States he is happy with his current hearing status.  Advance directive discussed with patient -He will take the forms home and discussed these with his daughter.  He may return them at his convenience with questions   Discussed PSA screening (risks/benefits), recommended at least 30  minutes of aerobic activity at least 5 days/week; proper sunscreen use reviewed; healthy diet and alcohol recommendations (less than or equal to 2 drinks/day) reviewed; regular seatbelt use; changing batteries in smoke detectors. Immunization recommendations discussed.  Colonoscopy recommendations reviewed.   Medicare Attestation I have personally reviewed: The patient's medical and social history Their use of alcohol, tobacco or illicit drugs Their current medications and supplements The patient's functional ability including ADLs,fall risks, home safety risks, cognitive, and hearing and visual impairment Diet and physical activities Evidence for depression or mood disorders  The patient's weight, height, and BMI have been recorded in the chart.  I have made referrals, counseling, and provided education to the patient based on review of the above and I have provided the patient with a written personalized care plan for preventive services.     Harland Dingwall, NP-C   02/18/2020

## 2020-02-18 ENCOUNTER — Other Ambulatory Visit: Payer: Self-pay

## 2020-02-18 ENCOUNTER — Ambulatory Visit (INDEPENDENT_AMBULATORY_CARE_PROVIDER_SITE_OTHER): Payer: Medicare Other | Admitting: Family Medicine

## 2020-02-18 ENCOUNTER — Encounter: Payer: Self-pay | Admitting: Family Medicine

## 2020-02-18 VITALS — BP 130/70 | HR 56 | Ht 66.0 in | Wt 135.2 lb

## 2020-02-18 DIAGNOSIS — Z0000 Encounter for general adult medical examination without abnormal findings: Secondary | ICD-10-CM

## 2020-02-18 DIAGNOSIS — Z7189 Other specified counseling: Secondary | ICD-10-CM | POA: Diagnosis not present

## 2020-02-18 DIAGNOSIS — H9113 Presbycusis, bilateral: Secondary | ICD-10-CM | POA: Diagnosis not present

## 2020-02-18 NOTE — Patient Instructions (Addendum)
  Mr. David [name] , Thank you for taking time to come for your Medicare Wellness Visit. I appreciate your ongoing commitment to your health goals. Please review the following plan we discussed and let me know if I can assist you in the future.   These are the goals we discussed:  Call and schedule an eye exam.   Call and schedule with a dentist to help your with chewing.   You can get your Tdap (tetanus, diptheria and pertussis) vaccine at your local pharmacy. Do not get any vaccines within 2 weeks of your last Covid vaccine.       This is a list of the screening recommended for you and due dates:  Health Maintenance  Topic Date Due  . COVID-19 Vaccine (1) Never done  . Tetanus Vaccine  Never done  . Pneumonia vaccines (1 of 2 - PCV13) Never done  . Flu Shot  03/27/2020

## 2020-07-07 IMAGING — CR DG CHEST 2V
2 series · 2 of 2 positions shown · non-contrast
Comparison: None.

CLINICAL DATA: Generalized weakness.

EXAM:
CHEST - 2 VIEW

[chest lat]
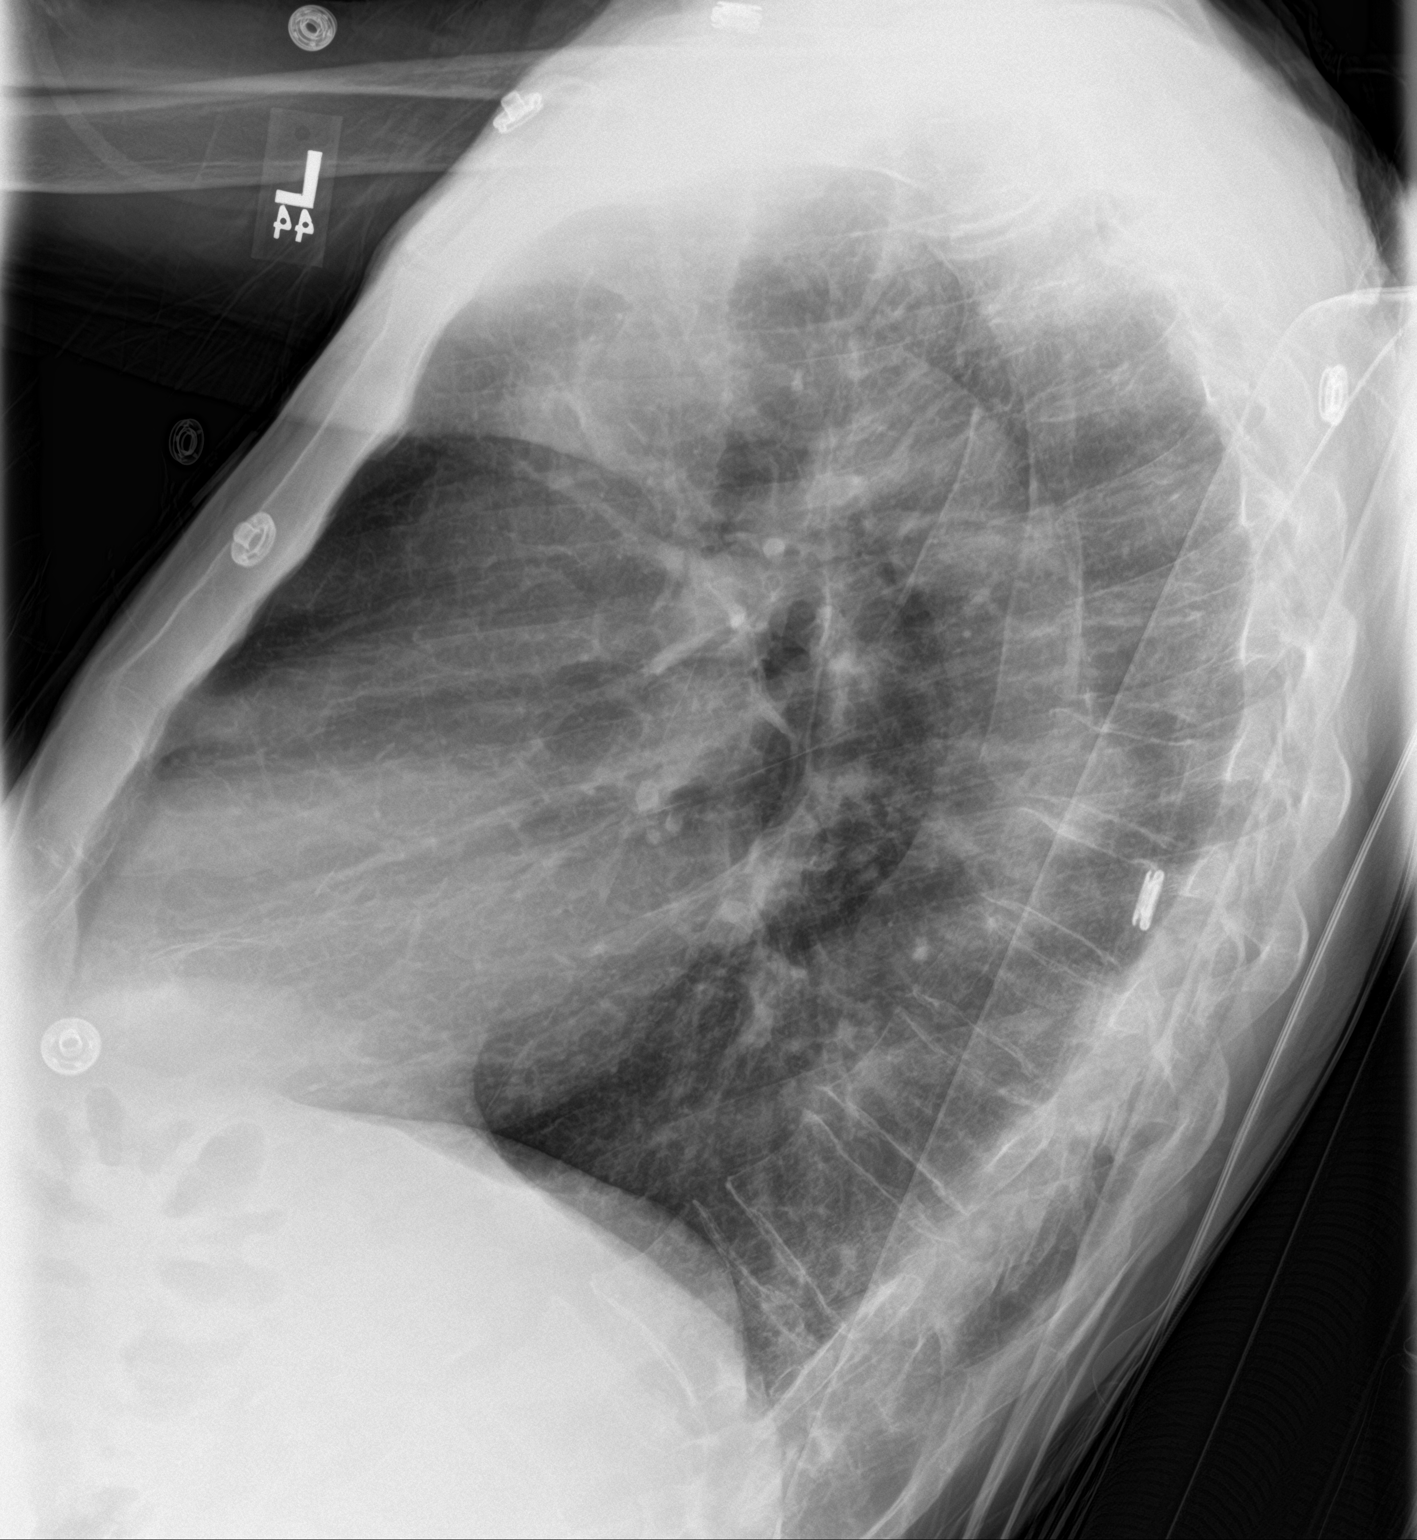

[chest ap]
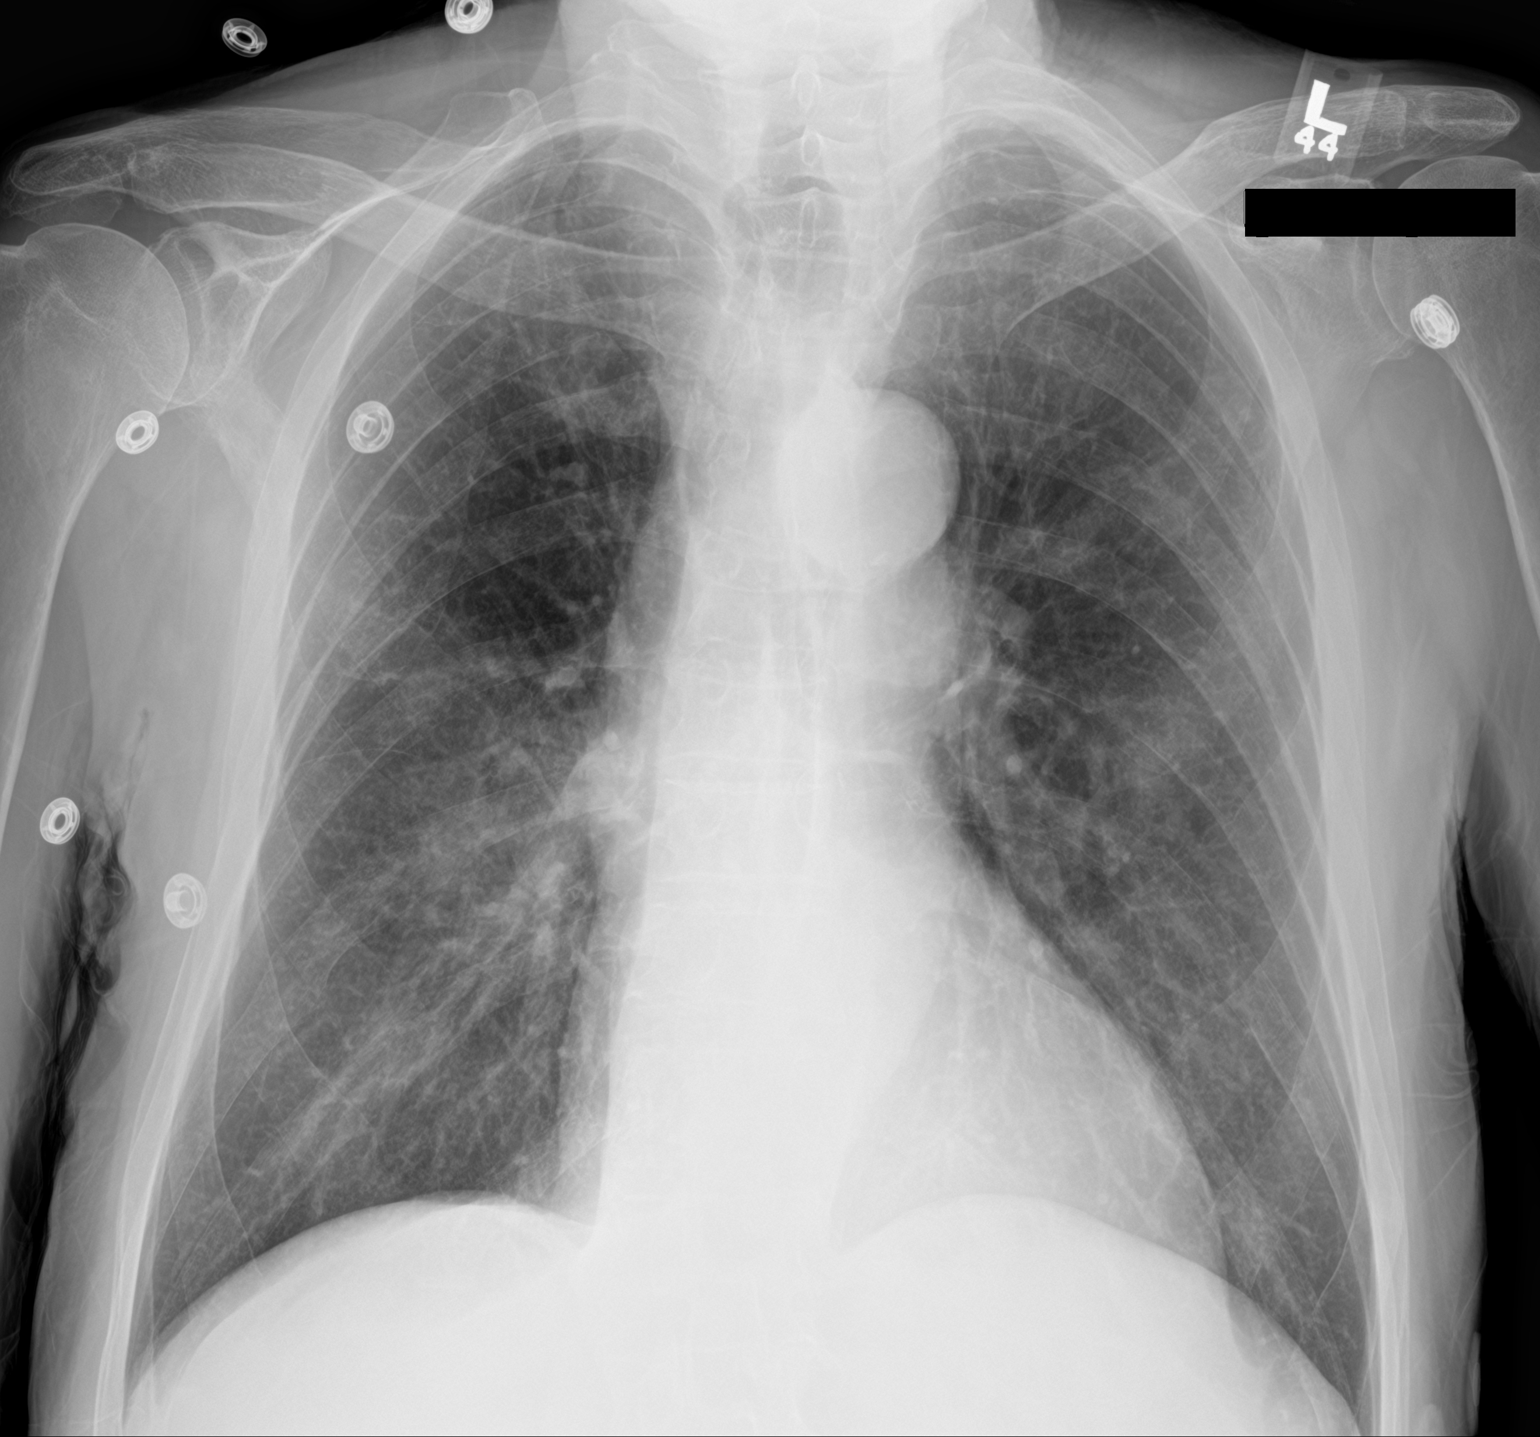

[2 of 2 positions shown; findings below may reference images not displayed]

FINDINGS: The lungs are hyperinflated. There is no evidence of acute
infiltrate, pleural effusion or pneumothorax. The heart size and
mediastinal contours are within normal limits. There is tortuosity
of the descending thoracic aorta. The visualized skeletal structures
are unremarkable.
IMPRESSION: No active cardiopulmonary disease.

## 2020-08-15 ENCOUNTER — Encounter: Payer: Medicare Other | Admitting: Family Medicine

## 2021-01-12 ENCOUNTER — Encounter: Payer: Self-pay | Admitting: Podiatry

## 2021-01-12 ENCOUNTER — Ambulatory Visit: Payer: Medicare Other | Admitting: Podiatry

## 2021-01-12 ENCOUNTER — Ambulatory Visit (INDEPENDENT_AMBULATORY_CARE_PROVIDER_SITE_OTHER): Payer: Medicare Other

## 2021-01-12 ENCOUNTER — Other Ambulatory Visit: Payer: Self-pay

## 2021-01-12 DIAGNOSIS — M21619 Bunion of unspecified foot: Secondary | ICD-10-CM

## 2021-01-12 DIAGNOSIS — M21611 Bunion of right foot: Secondary | ICD-10-CM

## 2021-01-12 DIAGNOSIS — L84 Corns and callosities: Secondary | ICD-10-CM | POA: Diagnosis not present

## 2021-01-12 DIAGNOSIS — M21612 Bunion of left foot: Secondary | ICD-10-CM

## 2021-01-12 DIAGNOSIS — L600 Ingrowing nail: Secondary | ICD-10-CM

## 2021-01-12 NOTE — Progress Notes (Signed)
Subjective:   Patient ID: David [name], male   DOB: 85 y.o.   MRN: [id number]   HPI Patient presents with a severely thickened right hallux nail that is dystrophic painful and has keratotic lesion subthird metatarsal first metatarsal bilateral painful with severe foot structure bilateral that makes it difficult to wear shoe gear comfortably.  Patient does not smoke and is very active still at home   Review of Systems  All other systems reviewed and are negative.       Objective:  Physical Exam Vitals and nursing note reviewed.  Constitutional:      Appearance: He is well-developed.  Pulmonary:     Effort: Pulmonary effort is normal.  Musculoskeletal:        General: Normal range of motion.  Skin:    General: Skin is warm.  Neurological:     Mental Status: He is alert.     Neurovascular status was found to be intact muscle strength is found to be adequate range of motion adequate.  Patient is found to have a severely dystrophic hallux nail right very thickened and loose on the bed painful when pressed and has significant severe bunion deformity left over right with digital deformities bilateral plantarflexed metatarsal with severe keratotic lesions.  Patient has good digital perfusion well oriented x3     Assessment:  Severely damaged right hallux nail with pain and severe structural damage bilateral with plantarflexed metatarsal keratotic lesions left and right foot     Plan:  H&P x-rays reviewed and today have recommended nail removal explained procedure to patient and allowed him to read and then signed consent form understanding risk.  I infiltrated the right hallux 60 mg like Marcaine mixture sterile prep done and using sterile instrumentation I remove the hallux nail exposed matrix applied phenol for applications 30 seconds followed by alcohol lavage and sterile dressing.  I debrided lesions do not recommend bunion correction currently or hammertoe correction but I did  recommend wider shoes mesh materials  X-rays indicate severe structural malalignment of both feet with complete collapse of the fourth MPJ left foot

## 2021-01-12 NOTE — Patient Instructions (Signed)

## 2021-08-09 DIAGNOSIS — H524 Presbyopia: Secondary | ICD-10-CM | POA: Diagnosis not present

## 2021-08-09 DIAGNOSIS — H52223 Regular astigmatism, bilateral: Secondary | ICD-10-CM | POA: Diagnosis not present

## 2021-08-09 DIAGNOSIS — H5212 Myopia, left eye: Secondary | ICD-10-CM | POA: Diagnosis not present

## 2021-09-13 ENCOUNTER — Encounter: Payer: Self-pay | Admitting: Physician Assistant

## 2021-12-18 ENCOUNTER — Telehealth: Payer: Self-pay

## 2021-12-18 NOTE — Telephone Encounter (Signed)
called pt to scheduled a med check. KH  ?

## 2022-09-18 ENCOUNTER — Other Ambulatory Visit: Payer: Self-pay

## 2022-09-18 ENCOUNTER — Emergency Department (HOSPITAL_COMMUNITY): Payer: Medicare Other

## 2022-09-18 ENCOUNTER — Emergency Department (HOSPITAL_COMMUNITY)
Admission: EM | Admit: 2022-09-18 | Discharge: 2022-09-19 | Disposition: A | Discharge status: Home / Self Care | Payer: Medicare Other | Attending: Emergency Medicine | Admitting: Emergency Medicine

## 2022-09-18 DIAGNOSIS — K440 Diaphragmatic hernia with obstruction, without gangrene: Secondary | ICD-10-CM | POA: Diagnosis not present

## 2022-09-18 DIAGNOSIS — K449 Diaphragmatic hernia without obstruction or gangrene: Secondary | ICD-10-CM

## 2022-09-18 DIAGNOSIS — R000 Tachycardia, unspecified: Secondary | ICD-10-CM | POA: Diagnosis not present

## 2022-09-18 DIAGNOSIS — K760 Fatty (change of) liver, not elsewhere classified: Secondary | ICD-10-CM | POA: Diagnosis not present

## 2022-09-18 DIAGNOSIS — R0602 Shortness of breath: Secondary | ICD-10-CM | POA: Diagnosis not present

## 2022-09-18 DIAGNOSIS — R7989 Other specified abnormal findings of blood chemistry: Secondary | ICD-10-CM

## 2022-09-18 DIAGNOSIS — I700 Atherosclerosis of aorta: Secondary | ICD-10-CM | POA: Diagnosis not present

## 2022-09-18 DIAGNOSIS — R109 Unspecified abdominal pain: Secondary | ICD-10-CM | POA: Diagnosis not present

## 2022-09-18 LAB — CBC WITH DIFFERENTIAL/PLATELET
Abs Immature Granulocytes: 0.02 10*3/uL (ref 0.00–0.07)
Basophils Absolute: 0.1 10*3/uL (ref 0.0–0.1)
Basophils Relative: 1 %
Eosinophils Absolute: 0.2 10*3/uL (ref 0.0–0.5)
Eosinophils Relative: 2 %
HCT: 46.3 % (ref 39.0–52.0)
Hemoglobin: 14.8 g/dL (ref 13.0–17.0)
Immature Granulocytes: 0 %
Lymphocytes Relative: 17 %
Lymphs Abs: 1.4 10*3/uL (ref 0.7–4.0)
MCH: 28.6 pg (ref 26.0–34.0)
MCHC: 32 g/dL (ref 30.0–36.0)
MCV: 89.4 fL (ref 80.0–100.0)
Monocytes Absolute: 0.5 10*3/uL (ref 0.1–1.0)
Monocytes Relative: 6 %
Neutro Abs: 6.2 10*3/uL (ref 1.7–7.7)
Neutrophils Relative %: 74 %
Platelets: 216 10*3/uL (ref 150–400)
RBC: 5.18 MIL/uL (ref 4.22–5.81)
RDW: 13.8 % (ref 11.5–15.5)
WBC: 8.4 10*3/uL (ref 4.0–10.5)
nRBC: 0 % (ref 0.0–0.2)

## 2022-09-18 LAB — COMPREHENSIVE METABOLIC PANEL
ALT: 20 U/L (ref 0–44)
AST: 26 U/L (ref 15–41)
Albumin: 3.9 g/dL (ref 3.5–5.0)
Alkaline Phosphatase: 48 U/L (ref 38–126)
Anion gap: 9 (ref 5–15)
BUN: 25 mg/dL — ABNORMAL HIGH (ref 8–23)
CO2: 28 mmol/L (ref 22–32)
Calcium: 9.7 mg/dL (ref 8.9–10.3)
Chloride: 101 mmol/L (ref 98–111)
Creatinine, Ser: 0.95 mg/dL (ref 0.61–1.24)
GFR, Estimated: >60 mL/min (ref >60)
Glucose, Bld: 91 mg/dL (ref 70–99)
Potassium: 4.3 mmol/L (ref 3.5–5.1)
Sodium: 138 mmol/L (ref 135–145)
Total Bilirubin: 0.7 mg/dL (ref 0.3–1.2)
Total Protein: 7 g/dL (ref 6.5–8.1)

## 2022-09-18 LAB — LIPASE, BLOOD: Lipase: 28 U/L (ref 11–51)

## 2022-09-18 LAB — TROPONIN I (HIGH SENSITIVITY): Troponin I (High Sensitivity): 11 ng/L (ref <18)

## 2022-09-18 MED ORDER — IOHEXOL 300 MG/ML  SOLN
100.0000 mL | Freq: Once | INTRAMUSCULAR | Status: AC | PRN
  Administered 2022-09-18: 100 mL via INTRAVENOUS

## 2022-09-18 NOTE — ED Provider Triage Note (Signed)
Emergency Medicine Provider Triage Evaluation Note  David [name] , a 87 y.o. male  was evaluated in triage.  Pt complains of abdominal pain shortness of breath.  Patient reports he is having generalized abdominal pain and some increased shortness of breath.  Patient also reports some associated congestion, runny nose.  Patient denies any nausea, vomiting, diarrhea.  Patient reports that he is not currently on any blood thinners or aspirin.  Patient is on any other medications denies any chronic health conditions such as any cardiovascular disease or pulmonary conditions.  Review of Systems  Positive: As above Negative: As above  Physical Exam  There were no vitals taken for this visit. Gen:   Awake, no distress   Resp:  Normal effort, no wheezing or crackles MSK:   Moves extremities without difficulty Other:  Generalized abdominal pain  Medical Decision Making  Medically screening exam initiated at 3:02 PM.  Appropriate orders placed.  David [name] was informed that the remainder of the evaluation will be completed by another provider, this initial triage assessment does not replace that evaluation, and the importance of remaining in the ED until their evaluation is complete.     David Heller, PA-C 09/18/22 1503

## 2022-09-18 NOTE — ED Triage Notes (Addendum)
C/o mid abd pain with sob, congestion and runny nose x 1 week Denies n/v/d/cp

## 2022-09-19 ENCOUNTER — Other Ambulatory Visit (HOSPITAL_COMMUNITY): Payer: Self-pay

## 2022-09-19 LAB — TROPONIN I (HIGH SENSITIVITY)
Troponin I (High Sensitivity): 19 ng/L — ABNORMAL HIGH (ref <18)
Troponin I (High Sensitivity): 19 ng/L — ABNORMAL HIGH (ref <18)

## 2022-09-19 MED ORDER — PANTOPRAZOLE SODIUM 40 MG PO TBEC: 40.0000 mg | DELAYED_RELEASE_TABLET | Freq: Every day | ORAL | Status: DC

## 2022-09-19 MED ORDER — OMEPRAZOLE 20 MG PO CPDR: 20.0000 mg | DELAYED_RELEASE_CAPSULE | Freq: Every day | ORAL | 0 refills | Status: DC

## 2022-09-19 MED ORDER — PANTOPRAZOLE SODIUM 40 MG PO TBEC
40.0000 mg | DELAYED_RELEASE_TABLET | Freq: Every day | ORAL | Status: DC
  Administered 2022-09-19: 40 mg via ORAL
  Filled 2022-09-19: qty 1

## 2022-09-19 MED ORDER — OMEPRAZOLE 20 MG PO CPDR
DELAYED_RELEASE_CAPSULE | Freq: Every day | ORAL | 0 refills | Status: DC
  Filled 2022-09-19: qty 30, 30d supply, fill #0

## 2022-09-19 NOTE — Discharge Instructions (Signed)
You were seen today for abdominal pain.  Your symptoms are likely related to the hiatal hernia and some gastritis.  Start omeprazole daily.  Of note, your troponin was just slightly elevated going from 11 to 19 and flat.  I do not believe your symptoms today are consistent with a heart attack.  However, given your risk factors, would recommend that you follow-up with cardiology for any definitive testing.  If you do develop chest pain or shortness of breath, you should be reevaluated immediately.

## 2022-09-19 NOTE — ED Provider Notes (Signed)
David [name]   CSN: [id number] Arrival date & time: 09/18/22  1438     History  Chief Complaint  Patient presents with   Abdominal Pain    David [name] is a 87 y.o. male.  HPI     This is an 87 year old male who presents with abdominal pain.  Patient reports that he had abdominal pain prior to arrival.  He reports that it was dull in his mid abdomen.  Denies associated nausea, vomiting, change in bowels.  Currently is pain-free.  States he has been pain-free since being in the emergency department.  No change in urination.  Denies fevers, cough.  Denies association with food.  Denies alcohol use or NSAID use.  Denies CP or SOB.  Home Medications Prior to Admission medications   Medication Sig Start Date End Date Taking? Authorizing Provider  omeprazole (PRILOSEC) 20 MG capsule Take 1 capsule (20 mg total) by mouth daily. 09/19/22  Yes Jasmyne Lodato, [name], MD      Allergies    Patient has no known allergies.    Review of Systems   Review of Systems  Constitutional:  Negative for fever.  Respiratory:  Negative for shortness of breath.   Cardiovascular:  Negative for chest pain.  Gastrointestinal:  Positive for abdominal pain. Negative for constipation, diarrhea, nausea and vomiting.  All other systems reviewed and are negative.   Physical Exam Updated Vital Signs BP (!) 145/89   Pulse 86   Temp 97.6 F (36.4 C) (Oral)   Resp (!) 24   SpO2 95%  Physical Exam Vitals and nursing [name] reviewed.  Constitutional:      Appearance: He is well-developed.     Comments: Chronically ill-appearing, no acute distress  HENT:     Head: Normocephalic and atraumatic.  Eyes:     Pupils: Pupils are equal, round, and reactive to light.  Cardiovascular:     Rate and Rhythm: Normal rate and regular rhythm.     Heart sounds: Normal heart sounds. No murmur heard. Pulmonary:     Effort: Pulmonary effort is normal. No  respiratory distress.     Breath sounds: Normal breath sounds. No wheezing.  Abdominal:     General: Bowel sounds are normal.     Palpations: Abdomen is soft.     Tenderness: There is no abdominal tenderness. There is no guarding or rebound.  Musculoskeletal:     Cervical back: Neck supple.  Lymphadenopathy:     Cervical: No cervical adenopathy.  Skin:    General: Skin is warm and dry.  Neurological:     Mental Status: He is alert and oriented to person, place, and time.  Psychiatric:        Mood and Affect: Mood normal.     ED Results / Procedures / Treatments   Labs (all labs ordered are listed, but only abnormal results are displayed) Labs Reviewed  COMPREHENSIVE METABOLIC PANEL - Abnormal; Notable for the following components:      Result Value   BUN 25 (*)    All other components within normal limits  TROPONIN I (HIGH SENSITIVITY) - Abnormal; Notable for the following components:   Troponin I (High Sensitivity) 19 (*)    All other components within normal limits  TROPONIN I (HIGH SENSITIVITY) - Abnormal; Notable for the following components:   Troponin I (High Sensitivity) 19 (*)    All other components within normal limits  CBC WITH DIFFERENTIAL/PLATELET  LIPASE, BLOOD  TROPONIN I (HIGH SENSITIVITY)  TROPONIN I (HIGH SENSITIVITY)    EKG EKG Interpretation  Date/Time:  Tuesday September 18 2022 15:11:18 EST Ventricular Rate:  95 PR Interval:  156 QRS Duration: 155 QT Interval:  363 QTC Calculation: 457 R Axis:   101 Text Interpretation: Sinus tachycardia Paired ventricular premature complexes Biatrial enlargement RBBB and LPFB Baseline wander in lead(s) V2 Confirmed by Thayer Jew [phone]) on 09/19/2022 1:00:00 AM  Radiology CT ABDOMEN PELVIS W CONTRAST  Result Date: 09/18/2022 CLINICAL DATA:  Mid abdominal pain. EXAM: CT ABDOMEN AND PELVIS WITH CONTRAST TECHNIQUE: Multidetector CT imaging of the abdomen and pelvis was performed using the standard protocol  following bolus administration of intravenous contrast. RADIATION DOSE REDUCTION: This exam was performed according to the departmental dose-optimization program which includes automated exposure control, adjustment of the mA and/or kV according to patient size and/or use of iterative reconstruction technique. CONTRAST:  144mL OMNIPAQUE IOHEXOL 300 MG/ML  SOLN COMPARISON:  None Available. FINDINGS: Lower chest: Mild bibasilar linear atelectasis is seen. Hepatobiliary: There is diffuse fatty infiltration of the liver parenchyma. No focal liver abnormality is seen. No gallstones, gallbladder wall thickening, or biliary dilatation. Pancreas: Diffuse atrophy of the pancreatic parenchyma is noted. No pancreatic ductal dilatation or surrounding inflammatory changes. Spleen: Normal in size without focal abnormality. Adrenals/Urinary Tract: Adrenal glands are unremarkable. Kidneys are normal, without renal calculi, focal lesion, or hydronephrosis. Bladder is unremarkable. Stomach/Bowel: There is a small hiatal hernia. Diffuse gastric wall thickening is seen within this region (axial CT image 12, CT series 2). Appendix appears normal. A large amount of stool is seen throughout the colon. No evidence of bowel wall thickening, distention, or inflammatory changes. Noninflamed diverticula are seen throughout the large bowel. Vascular/Lymphatic: Aortic atherosclerosis. No enlarged abdominal or pelvic lymph nodes. Reproductive: The prostate gland is mildly enlarged. Other: There is a 4.6 cm x 2.7 cm right inguinal hernia. This contains fat and loops of normal appearing distal small bowel. A 3.3 cm x 2.1 cm fat containing left inguinal hernia is also seen. No abdominopelvic ascites. Musculoskeletal: Multilevel degenerative changes are seen throughout the lumbar spine. IMPRESSION: 1. Small hiatal hernia and associated gastric wall thickening which may represent sequelae associated with mild to moderate severity gastritis. 2. Colonic  diverticulosis. 3. Large stool burden without evidence of bowel obstruction. 4. Hepatic steatosis. 5. Bilateral inguinal hernias, as described above. 6. Aortic atherosclerosis. Electronically Signed   By: Virgina Norfolk M.D.   On: 09/18/2022 20:21   DG Chest Portable 1 View  Result Date: 09/18/2022 CLINICAL DATA:  Shortness of breath. EXAM: PORTABLE CHEST 1 VIEW COMPARISON:  November 08, 2019. FINDINGS: The heart size and mediastinal contours are within normal limits. Both lungs are clear. The visualized skeletal structures are unremarkable. IMPRESSION: No active disease. Electronically Signed   By: Marijo Conception M.D.   On: 09/18/2022 15:13    Procedures Procedures    Medications Ordered in ED Medications  pantoprazole (PROTONIX) EC tablet 40 mg (40 mg Oral Given 09/19/22 0128)  iohexol (OMNIPAQUE) 300 MG/ML solution 100 mL (100 mLs Intravenous Contrast Given 09/18/22 1920)    ED Course/ Medical Decision Making/ A&P Clinical Course as of 09/19/22 0449  Wed Sep 19, 2022  0247 With patient and his daughter.  Very slight bump in troponin.  Clinically speaking, feel his symptoms are most likely related to his hiatal hernia and gastritis.  However given his age and risk factors, would recommend a third troponin to trend.  His history would be very atypical for ACS. [CH]    Clinical Course User Index [CH] Chief Walkup, Mayer Masker, MD                             Medical Decision Making Risk Prescription drug management.   This patient presents to the ED for concern of abdominal pain, this involves an extensive number of treatment options, and is a complaint that carries with it a high risk of complications and morbidity.  I considered the following differential and admission for this acute, potentially life threatening condition.  The differential diagnosis includes gastritis, pancreatitis, cholecystitis, obstructive pathology, atypical ACS  MDM:    This is an 87 year old male who presents with  abdominal pain.  Currently he is comfortable and without any symptoms.  He is nontoxic.  Vital signs are largely reassuring.  Labs obtained and reviewed from triage.  Initial troponin -11.  CMP reassuring with normal LFTs and lipase.  CT scan obtained and shows a small hiatal hernia and possible gastritis.  This could be the cause of the patient's symptoms especially given benign abdominal exam.  Patient was given a dose of Protonix.  Repeat troponin slightly elevated at 19.  Patient has not had any active or ongoing chest discomfort or shortness of breath.  EKG shows a right bundle branch block and a left anterior fascicular block and is mostly unchanged from prior.  I feel his symptoms are more likely associated with gastritis and hiatal hernia.  We will repeat troponin for third time to continue to trend.  If flat or downtrending, I feel it is unlikely due to primary ACS.  Repeat troponin 19 and flat.  Will have him follow-up with cardiology.  In the meantime we will start omeprazole daily.  Patient and daughter agreeable to plan.  (Labs, imaging, consults)  Labs: I Ordered, and personally interpreted labs.  The pertinent results include: CBC, CMP, lipase, troponin  Imaging Studies ordered: I ordered imaging studies including chest x-ray, abdominal CT I independently visualized and interpreted imaging. I agree with the radiologist interpretation  Additional history obtained from daughter at bedside.  External records from outside source obtained and reviewed including prior evaluations  Cardiac Monitoring: The patient was maintained on a cardiac monitor.  I personally viewed and interpreted the cardiac monitored which showed an underlying rhythm of: Sinus rhythm  Reevaluation: After the interventions noted above, I reevaluated the patient and found that they have :resolved  Social Determinants of Health:  lives independently  Disposition: Discharge  Co morbidities that complicate the  patient evaluation  Past Medical History:  Diagnosis Date   History of Bell's palsy    History of kidney stones      Medicines Meds ordered this encounter  Medications   iohexol (OMNIPAQUE) 300 MG/ML solution 100 mL   DISCONTD: pantoprazole (PROTONIX) EC tablet 40 mg   pantoprazole (PROTONIX) EC tablet 40 mg   omeprazole (PRILOSEC) 20 MG capsule    Sig: Take 1 capsule (20 mg total) by mouth daily.    Dispense:  30 capsule    Refill:  0    I have reviewed the patients home medicines and have made adjustments as needed  Problem List / ED Course: Problem List Items Addressed This Visit   None Visit Diagnoses     Hiatal hernia    -  Primary   Elevated troponin level not due myocardial infarction  Relevant Orders   Ambulatory referral to Cardiology                   Final Clinical Impression(s) / ED Diagnoses Final diagnoses:  Hiatal hernia  Elevated troponin level not due myocardial infarction    Rx / DC Orders ED Discharge Orders          Ordered    omeprazole (PRILOSEC) 20 MG capsule  Daily        09/19/22 0447    Ambulatory referral to Cardiology        09/19/22 0447              Aaryn Sermon, Mayer Masker, MD 09/19/22 [phone]

## 2022-09-23 NOTE — Progress Notes (Signed)
Worthy Keeler, DNP, AGNP-c [place]  [address] [place], [place] [address] [phone]  ESTABLISHED PATIENT- Chronic Health and/or Follow-Up Visit  Blood pressure 136/82, pulse (!) 46, weight 127 lb 12.8 oz (58 kg), SpO2 96 %.    David [name] is a 87 y.o. year old male presenting today for evaluation and management of the following: Hospital Follow-Up Mr. Detloff presents today for hospital follow-up with recent ED visit.  CT scan in ED showed presence of fatty liver, pancreatic parenchyma atrophy (without inflammatory changes present), small hiatal hernia, diffuse gastric wall thickening indicating gastritis, stool burden, diverticula, aortic atherosclerosis, mildly enlarged prostate gland, right inguinal hernia with normal bowel, left inguinal hernia, and DDD on multiple levels.  EKG showed R BBB and left anterior fascicular block. Troponins were elevated initially, but were flat on 3rd check.  ED started him on omeprazole for gastritis and recommended cardiology evaluation.   Today he tells me: He is experiencing new and increased shortness of breath for about the past week. He is not quite sure if he felt this at the time he was in the hospital. The shortness of breath is worse with moving. He tells me he feels that his breathing is labored. Cardiology appt March 18 with Dr. Oval Linsey as new patient. He is typically very active and this is a significant change for him. He is not having any dizziness or chest pain. His daughter tells me that he is markedly weak.   Stomach pain- he tells me this is almost gone and he can eat now. He has started taking the omeprazole twice a day as opposed to once. He is not having any burning or sour taste in his mouth or throat.   All ROS negative with exception of what is listed above.   PHYSICAL EXAM Physical Exam Vitals and nursing note reviewed.  Constitutional:      Appearance: He is underweight.  HENT:     Head:  Normocephalic and atraumatic.  Eyes:     Pupils: Pupils are equal, round, and reactive to light.  Neck:     Vascular: No carotid bruit.  Cardiovascular:     Rate and Rhythm: Normal rate and regular rhythm.     Pulses: Normal pulses.     Heart sounds: Normal heart sounds.  Pulmonary:     Effort: Pulmonary effort is normal.     Breath sounds: Normal breath sounds.  Abdominal:     General: Abdomen is flat. There is no distension.     Palpations: Abdomen is soft.     Tenderness: There is no abdominal tenderness. There is no guarding.     Hernia: A hernia is present.  Musculoskeletal:     Cervical back: Normal range of motion.  Skin:    General: Skin is warm and dry.     Capillary Refill: Capillary refill takes less than 2 seconds.  Neurological:     General: No focal deficit present.     Mental Status: He is alert and oriented to person, place, and time.  Psychiatric:        Mood and Affect: Mood normal.     PLAN Problem List Items Addressed This Visit   Recent ED visit with multiple concerning findings present. Patient remains short of breath at rest and with exertion with no clear single cause. It is possible the size of the hiatal hernia is causing decreased area for respiration. CT and lab work in the ED unrevealing. At this time he  has not followed up with cardiology and has not had a GI referral. I will obtain labs today for further evaluation in an attempt to determine the cause of symptoms. His oxygen saturations are stable and he is not in any acute distress, which is reassuring. He is clearly different than his baseline per their description. Will make changes to the plan of care as necessary based on labs.     Dyspnea on exertion - Primary   Relevant Orders   Ambulatory referral to Cardiology   CBC with Differential/Platelet (Completed)   Iron, TIBC and Ferritin Panel (Completed)   Brain natriuretic peptide (Completed)   Aortic atherosclerosis ([facility])   Relevant Orders    Ambulatory referral to Cardiology   Brain natriuretic peptide (Completed)   Diverticulosis   Relevant Orders   Ambulatory referral to Gastroenterology   Right inguinal hernia   Relevant Orders   Ambulatory referral to Gastroenterology   Pancreatic atrophy   Left inguinal hernia   Relevant Orders   Ambulatory referral to Gastroenterology   Hiatal hernia   Relevant Medications   omeprazole (PRILOSEC) 20 MG capsule   Other Relevant Orders   Ambulatory referral to Gastroenterology   DDD (degenerative disc disease), thoracolumbar   Right bundle branch block (RBBB) on electrocardiogram (ECG)   Relevant Orders   Ambulatory referral to Cardiology   Left anterior fascicular block (LAFB) determined by electrocardiography   Relevant Orders   Ambulatory referral to Cardiology   Other Visit Diagnoses     Hepatic steatosis       Relevant Orders   Ambulatory referral to Gastroenterology   Prostate enlargement           Return in about 6 months (around 03/25/2023).   Worthy Keeler, DNP, AGNP-c

## 2022-09-24 ENCOUNTER — Encounter: Payer: Self-pay | Admitting: Nurse Practitioner

## 2022-09-24 ENCOUNTER — Ambulatory Visit (INDEPENDENT_AMBULATORY_CARE_PROVIDER_SITE_OTHER): Payer: Medicare Other | Admitting: Nurse Practitioner

## 2022-09-24 ENCOUNTER — Other Ambulatory Visit (HOSPITAL_COMMUNITY): Payer: Self-pay

## 2022-09-24 VITALS — BP 136/82 | HR 46 | Wt 127.8 lb

## 2022-09-24 DIAGNOSIS — R0609 Other forms of dyspnea: Secondary | ICD-10-CM | POA: Diagnosis not present

## 2022-09-24 DIAGNOSIS — K449 Diaphragmatic hernia without obstruction or gangrene: Secondary | ICD-10-CM

## 2022-09-24 DIAGNOSIS — K5790 Diverticulosis of intestine, part unspecified, without perforation or abscess without bleeding: Secondary | ICD-10-CM

## 2022-09-24 DIAGNOSIS — I700 Atherosclerosis of aorta: Secondary | ICD-10-CM | POA: Diagnosis not present

## 2022-09-24 DIAGNOSIS — N400 Enlarged prostate without lower urinary tract symptoms: Secondary | ICD-10-CM

## 2022-09-24 DIAGNOSIS — K8689 Other specified diseases of pancreas: Secondary | ICD-10-CM

## 2022-09-24 DIAGNOSIS — K760 Fatty (change of) liver, not elsewhere classified: Secondary | ICD-10-CM

## 2022-09-24 DIAGNOSIS — K4090 Unilateral inguinal hernia, without obstruction or gangrene, not specified as recurrent: Secondary | ICD-10-CM | POA: Insufficient documentation

## 2022-09-24 DIAGNOSIS — I4510 Unspecified right bundle-branch block: Secondary | ICD-10-CM | POA: Insufficient documentation

## 2022-09-24 DIAGNOSIS — I444 Left anterior fascicular block: Secondary | ICD-10-CM

## 2022-09-24 DIAGNOSIS — M5135 Other intervertebral disc degeneration, thoracolumbar region: Secondary | ICD-10-CM

## 2022-09-24 MED ORDER — OMEPRAZOLE 20 MG PO CPDR
20.0000 mg | DELAYED_RELEASE_CAPSULE | Freq: Two times a day (BID) | ORAL | 1 refills | Status: AC
  Filled 2022-09-24 – 2022-10-04 (×2): qty 180, 90d supply, fill #0
  Filled 2023-01-28: qty 180, 90d supply, fill #1

## 2022-09-24 NOTE — Patient Instructions (Signed)
I will let you know what your labs show.   I have sent the referral to GI and the referral to Cardiology. I am hoping we can speed up the cardiology referral to get you in a little sooner given your symptoms.   Please let me know if you have questions.

## 2022-09-25 ENCOUNTER — Telehealth: Payer: Self-pay

## 2022-09-25 ENCOUNTER — Other Ambulatory Visit (HOSPITAL_COMMUNITY): Payer: Self-pay

## 2022-09-25 LAB — CBC WITH DIFFERENTIAL/PLATELET
Basophils Absolute: 0.1 10*3/uL (ref 0.0–0.2)
Basos: 1 %
EOS (ABSOLUTE): 0.2 10*3/uL (ref 0.0–0.4)
Eos: 3 %
Hematocrit: 43.6 % (ref 37.5–51.0)
Hemoglobin: 14.8 g/dL (ref 13.0–17.7)
Immature Grans (Abs): 0 10*3/uL (ref 0.0–0.1)
Immature Granulocytes: 0 %
Lymphocytes Absolute: 1.4 10*3/uL (ref 0.7–3.1)
Lymphs: 18 %
MCH: 28.8 pg (ref 26.6–33.0)
MCHC: 33.9 g/dL (ref 31.5–35.7)
MCV: 85 fL (ref 79–97)
Monocytes Absolute: 0.5 10*3/uL (ref 0.1–0.9)
Monocytes: 7 %
Neutrophils Absolute: 5.3 10*3/uL (ref 1.4–7.0)
Neutrophils: 71 %
Platelets: 172 10*3/uL (ref 150–450)
RBC: 5.13 x10E6/uL (ref 4.14–5.80)
RDW: 13.1 % (ref 11.6–15.4)
WBC: 7.5 10*3/uL (ref 3.4–10.8)

## 2022-09-25 LAB — IRON,TIBC AND FERRITIN PANEL
Ferritin: 105 ng/mL (ref 30–400)
Iron Saturation: 18 % (ref 15–55)
Iron: 52 ug/dL (ref 38–169)
Total Iron Binding Capacity: 286 ug/dL (ref 250–450)
UIBC: 234 ug/dL (ref 111–343)

## 2022-09-25 LAB — BRAIN NATRIURETIC PEPTIDE: BNP: 108.3 pg/mL — ABNORMAL HIGH (ref 0.0–100.0)

## 2022-09-25 NOTE — Telephone Encounter (Signed)
        Patient  visited [facility] on 1/24    Telephone encounter attempt :  1st  A HIPAA compliant voice message was left requesting a return call.  Instructed patient to call back     Lennex Pietila Pop Health Care Guide, [place] [phone] [address], [place], [place] [address] Phone: [phone] Email: [email or web address]       

## 2022-09-26 ENCOUNTER — Telehealth: Payer: Self-pay

## 2022-09-26 NOTE — Telephone Encounter (Signed)
Pt. Daughter called stating she wanted to know when you were going to make recommendations on his labs. She has viewed them but wanted to know the next steps they need to do now. She said he is still getting SOB when walking. Once you review labs send back to me and I can call her back.

## 2022-09-26 NOTE — Telephone Encounter (Signed)
        Patient  visited [facility] on 1/24   Telephone encounter attempt :  2nd  A HIPAA compliant voice message was left requesting a return call.  Instructed patient to call back    David [name] Pop Health Care Guide, [place] [phone] [address], [place], [place] [address] Phone: [phone] Email: [email or web address]       

## 2022-10-01 ENCOUNTER — Encounter: Payer: Self-pay | Admitting: Gastroenterology

## 2022-10-04 ENCOUNTER — Other Ambulatory Visit (HOSPITAL_COMMUNITY): Payer: Self-pay

## 2022-10-11 ENCOUNTER — Ambulatory Visit (INDEPENDENT_AMBULATORY_CARE_PROVIDER_SITE_OTHER): Payer: Medicare Other | Admitting: Cardiology

## 2022-10-11 ENCOUNTER — Encounter (HOSPITAL_BASED_OUTPATIENT_CLINIC_OR_DEPARTMENT_OTHER): Payer: Self-pay | Admitting: Cardiology

## 2022-10-11 VITALS — BP 146/80 | HR 51 | Ht 66.0 in | Wt 130.0 lb

## 2022-10-11 DIAGNOSIS — I700 Atherosclerosis of aorta: Secondary | ICD-10-CM | POA: Diagnosis not present

## 2022-10-11 DIAGNOSIS — Z7189 Other specified counseling: Secondary | ICD-10-CM

## 2022-10-11 DIAGNOSIS — R0609 Other forms of dyspnea: Secondary | ICD-10-CM | POA: Diagnosis not present

## 2022-10-11 DIAGNOSIS — R7989 Other specified abnormal findings of blood chemistry: Secondary | ICD-10-CM

## 2022-10-11 DIAGNOSIS — I4510 Unspecified right bundle-branch block: Secondary | ICD-10-CM

## 2022-10-11 NOTE — Patient Instructions (Signed)
Medication Instructions:  Your physician recommends that you continue on your current medications as directed. Please refer to the Current Medication list given to you today.  *If you need a refill on your cardiac medications before your next appointment, please call your pharmacy*  Lab Work: NONE  Testing/Procedures: Your physician has requested that you have an echocardiogram. Echocardiography is a painless test that uses sound waves to create images of your heart. It provides your doctor with information about the size and shape of your heart and how well your heart's chambers and valves are working. This procedure takes approximately one hour. There are no restrictions for this procedure. Please do NOT wear cologne, perfume, aftershave, or lotions (deodorant is allowed). Please arrive 15 minutes prior to your appointment time.  Follow-Up: At [facility], you and your health needs are our priority.  As part of our continuing mission to provide you with exceptional heart care, we have created designated Provider Care Teams.  These Care Teams include your primary Cardiologist (physician) and Advanced Practice Providers (APPs -  Physician Assistants and Nurse Practitioners) who all work together to provide you with the care you need, when you need it.  We recommend signing up for the patient portal called "MyChart".  Sign up information is provided on this After Visit Summary.  MyChart is used to connect with patients for Virtual Visits (Telemedicine).  Patients are able to view lab/test results, encounter notes, upcoming appointments, etc.  Non-urgent messages can be sent to your provider as well.   To learn more about what you can do with MyChart, go to [email or web address].    Your next appointment:   1 month(s)  The format for your next appointment:   In Person  Provider:   Buford Dresser, MD

## 2022-10-11 NOTE — Progress Notes (Signed)
Cardiology Office Note:    Date:  10/11/2022   David [name], DOB [birth year], MRN [id number]  PCP:  David Render, NP  Cardiologist:  Buford Dresser, MD  Referring MD: David Hacker, MD   CC: new patient evaluation for history of elevated troponin  History of Present Illness:    David [name] is a 87 y.o. male with no prior cardiac history who is seen as a new consult at the request of [name], [name], MD for the evaluation and management of elevated troponin without MI.  At his visit 10/04/22 with [name], NP after his ED visit for a hiatal hernia where his troponin had been elevated. He experienced new and increased shortness of breath modulating with exertion for the week around the ED admission.  Today, the patient presents with his daughter to help provide history. He reported to the ER due to abdominal pain. He became short of breath with even light exertion after his ED admission and has been short of breath since. He hasn't had very much activity since his hospital visit, he gets out of breath walking from room to room in the house. After he stops his activity it takes up to 30 minutes for his breath to return to normal. He was very active prior to his ED admission.   He denies any palpitations, chest pain or peripheral edema. No lightheadedness, headaches, syncope, orthopnea, or PND.  Past Medical History:  Diagnosis Date   History of Bell's palsy    History of kidney stones     Past Surgical History:  Procedure Laterality Date   TONSILLECTOMY      Current Medications: Current Outpatient Medications on File Prior to Visit  Medication Sig   omeprazole (PRILOSEC) 20 MG capsule Take 1 capsule (20 mg total) by mouth 2 (two) times daily before a meal.   No current facility-administered medications on file prior to visit.     Allergies:   Patient has no known allergies.   Social History   Tobacco Use   Smoking status: Never   Smokeless  tobacco: Never  Substance Use Topics   Alcohol use: No   Drug use: No    Family History: family history is not on file.  ROS:   Please see the history of present illness.  (+)SOB (exertionally induced) Additional pertinent ROS: Constitutional: Negative for chills, fever, night sweats, unintentional weight loss  HENT: Negative for ear pain and hearing loss.   Eyes: Negative for loss of vision and eye pain.  Respiratory: Negative for cough, sputum, wheezing.   Cardiovascular: See HPI. Gastrointestinal: Negative for abdominal pain, melena, and hematochezia.  Genitourinary: Negative for dysuria and hematuria.  Musculoskeletal: Negative for falls and myalgias.  Skin: Negative for itching and rash.  Neurological: Negative for focal weakness, focal sensory changes and loss of consciousness.  Endo/Heme/Allergies: Does not bruise/bleed easily.     EKGs/Labs/Other Studies Reviewed:    The following studies were reviewed today: No prior studies available  EKG:  EKG is personally reviewed.   10/11/2022: The EKG is not ordered.  Recent Labs: 09/18/2022: ALT 20; BUN 25; Creatinine, Ser 0.95; Potassium 4.3; Sodium 138 09/24/2022: BNP 108.3; Hemoglobin 14.8; Platelets 172  Recent Lipid Panel No results found for: "CHOL", "TRIG", "HDL", "CHOLHDL", "VLDL", "LDLCALC", "LDLDIRECT"  Physical Exam:    VS:  BP (!) 146/80 (BP Location: Right Arm, Patient Position: Sitting, Cuff Size: Normal)   Pulse (!) 51   Ht 5\' 6"  (1.676  m)   Wt 130 lb (59 kg)   BMI 20.98 kg/m     Wt Readings from Last 3 Encounters:  09/24/22 127 lb 12.8 oz (58 kg)  02/18/20 135 lb 3.2 oz (61.3 kg)  01/08/20 135 lb 3.2 oz (61.3 kg)    GEN: Well nourished, well developed in no acute distress HEENT: Normal, moist mucous membranes NECK: No JVD CARDIAC: regular rhythm, normal S1 and S2, no rubs or gallops. (1/6) murmur. S3 abnormality  VASCULAR: Radial and DP pulses 2+ bilaterally. No carotid bruits RESPIRATORY:  Clear  to auscultation without rales, wheezing or rhonchi  ABDOMEN: Soft, non-tender, non-distended MUSCULOSKELETAL:  Ambulates independently SKIN: Warm and dry, no edema NEUROLOGIC:  Alert and oriented x 3. No focal neuro deficits noted. PSYCHIATRIC:  Normal affect    ASSESSMENT:    1. DOE (dyspnea on exertion)   2. Elevated troponin level not due to acute coronary syndrome   3. Aortic atherosclerosis (HCC)   4. Cardiac risk counseling   5. Right bundle branch block (RBBB) on electrocardiogram (ECG)    PLAN:    Dyspnea on exertion History of elevated troponin -echo -discussed options. If echo largely unremarkable, they would prefer to manage noninvasively -reviewed red flag warning signs that need immediate medical attention  Aortic atherosclerosis CV risk counseling -given age, defers aspirin/statin  RBBB -asymptomatic  Plan for follow up: 1 month or earlier depending on results.  Buford Dresser, MD, PhD, [facility] at [facility] at [facility] [address], [place], [place] [address] [phone]   Medication Adjustments/Labs and Tests Ordered: Current medicines are reviewed at length with the patient today.  Concerns regarding medicines are outlined above.  Orders Placed This Encounter  Procedures   ECHOCARDIOGRAM COMPLETE   No orders of the defined types were placed in this encounter.   Patient Instructions  Medication Instructions:  Your physician recommends that you continue on your current medications as directed. Please refer to the Current Medication list given to you today.  *If you need a refill on your cardiac medications before your next appointment, please call your pharmacy*  Lab Work: NONE  Testing/Procedures: Your physician has requested that you have an echocardiogram. Echocardiography is a painless test that uses sound waves to create images of your  heart. It provides your doctor with information about the size and shape of your heart and how well your heart's chambers and valves are working. This procedure takes approximately one hour. There are no restrictions for this procedure. Please do NOT wear cologne, perfume, aftershave, or lotions (deodorant is allowed). Please arrive 15 minutes prior to your appointment time.  Follow-Up: At [facility], you and your health needs are our priority.  As part of our continuing mission to provide you with exceptional heart care, we have created designated Provider Care Teams.  These Care Teams include your primary Cardiologist (physician) and Advanced Practice Providers (APPs -  Physician Assistants and Nurse Practitioners) who all work together to provide you with the care you need, when you need it.  We recommend signing up for the patient portal called "MyChart".  Sign up information is provided on this After Visit Summary.  MyChart is used to connect with patients for Virtual Visits (Telemedicine).  Patients are able to view lab/test results, encounter notes, upcoming appointments, etc.  Non-urgent messages can be sent to your provider as well.   To learn more about  what you can do with MyChart, go to [email or web address].    Your next appointment:   1 month(s)  The format for your next appointment:   In Person  Provider:   Buford Dresser, MD       I,Coren O'Brien,acting as a scribe for Buford Dresser, MD.,have documented all relevant documentation on the behalf of Buford Dresser, MD,as directed by  Buford Dresser, MD while in the presence of Buford Dresser, MD.  I, Buford Dresser, MD, have reviewed all documentation for this visit. The documentation on 11/11/22 for the exam, diagnosis, procedures, and orders are all accurate and complete.

## 2022-10-31 ENCOUNTER — Ambulatory Visit (INDEPENDENT_AMBULATORY_CARE_PROVIDER_SITE_OTHER): Payer: Medicare Other

## 2022-10-31 DIAGNOSIS — R0609 Other forms of dyspnea: Secondary | ICD-10-CM | POA: Diagnosis not present

## 2022-10-31 DIAGNOSIS — R7989 Other specified abnormal findings of blood chemistry: Secondary | ICD-10-CM | POA: Diagnosis not present

## 2022-10-31 LAB — ECHOCARDIOGRAM COMPLETE
AR max vel: 3.01 cm2
AV Area VTI: 3.73 cm2
AV Area mean vel: 3.77 cm2
AV Mean grad: 3 mmHg
AV Peak grad: 8.4 mmHg
AV Vena cont: 0.64 cm
Ao pk vel: 1.45 m/s
Area-P 1/2: 5.58 cm2
Calc EF: 55.8 %
P 1/2 time: 370 msec
S' Lateral: 3.82 cm
Single Plane A2C EF: 57.8 %
Single Plane A4C EF: 51.8 %

## 2022-11-01 ENCOUNTER — Encounter (HOSPITAL_BASED_OUTPATIENT_CLINIC_OR_DEPARTMENT_OTHER): Payer: Self-pay

## 2022-11-01 ENCOUNTER — Ambulatory Visit: Payer: Medicare Other | Admitting: Gastroenterology

## 2022-11-01 ENCOUNTER — Encounter: Payer: Self-pay | Admitting: Gastroenterology

## 2022-11-01 VITALS — BP 118/76 | HR 45 | Ht 67.0 in | Wt 135.0 lb

## 2022-11-01 DIAGNOSIS — K449 Diaphragmatic hernia without obstruction or gangrene: Secondary | ICD-10-CM | POA: Diagnosis not present

## 2022-11-01 DIAGNOSIS — Z5320 Procedure and treatment not carried out because of patient's decision for unspecified reasons: Secondary | ICD-10-CM

## 2022-11-01 DIAGNOSIS — R1310 Dysphagia, unspecified: Secondary | ICD-10-CM | POA: Diagnosis not present

## 2022-11-01 DIAGNOSIS — R12 Heartburn: Secondary | ICD-10-CM | POA: Diagnosis not present

## 2022-11-01 DIAGNOSIS — K4020 Bilateral inguinal hernia, without obstruction or gangrene, not specified as recurrent: Secondary | ICD-10-CM

## 2022-11-01 DIAGNOSIS — K8689 Other specified diseases of pancreas: Secondary | ICD-10-CM

## 2022-11-01 DIAGNOSIS — K2990 Gastroduodenitis, unspecified, without bleeding: Secondary | ICD-10-CM

## 2022-11-01 DIAGNOSIS — K2970 Gastritis, unspecified, without bleeding: Secondary | ICD-10-CM | POA: Diagnosis not present

## 2022-11-01 DIAGNOSIS — K219 Gastro-esophageal reflux disease without esophagitis: Secondary | ICD-10-CM

## 2022-11-01 NOTE — Patient Instructions (Signed)
_______________________________________________________  If your blood pressure at your visit was 140/90 or greater, please contact your primary care physician to follow up on this.  _______________________________________________________  If you are age 85 or older, your body mass index should be between 23-30. Your Body mass index is 21.14 kg/m. If this is out of the aforementioned range listed, please consider follow up with your Primary Care Provider.  If you are age 47 or younger, your body mass index should be between 19-25. Your Body mass index is 21.14 kg/m. If this is out of the aformentioned range listed, please consider follow up with your Primary Care Provider.   ________________________________________________________  The [facility] providers would like to encourage you to use [facility] to communicate with providers for non-urgent requests or questions.  Due to long hold times on the telephone, sending your provider a message by [facility] may be a faster and more efficient way to get a response.  Please allow 48 business hours for a response.  Please remember that this is for non-urgent requests.  _______________________________________________________  Follow up as needed  Call with any questions or concerns.  It was a pleasure to see you today!  Vito Cirigliano, D.O.

## 2022-11-01 NOTE — Progress Notes (Signed)
Chief Complaint: Abdominal pain   Referring Provider:    Orma Render, NP    HPI:     David [name] is a 87 y.o. male with a history of RBBB, DDD, referred to the Gastroenterology Clinic for evaluation of abdominal pain and abnormal imaging from recent ER evaluation.  He went to the ER on 09/19/2022 with abdominal pain in the mid abdomen and SOB .  No nausea, vomiting, change in stools, fever.  Exam unremarkable. - Normal CBC, CMP, lipase - Troponin 19 - Normal CXR - CT A/P with contrast: Hepatic steatosis, diffuse atrophy of pancreatic parenchyma without PD dilation and no acute pancreatitis.  Small HH, diffuse gastric wall thickening.  Large stool burden without inflammation, obstruction.  Diverticulosis without diverticulitis.  4.6 x 2.7 cm right inguinal hernia contains fat and loops of normal-appearing distal small bowel.  3.3 x 2.1 cm fat-containing left inguinal hernia. - Pain resolved and ER and was discharged home with Prilosec 20 mg daily  Pain has resolved and not recurred.   Separately, has been having issues with solid food dysphagia.  Will essentially vomit/regurgitate food out after a couple of bites, symptoms resolved, and he can go back to eating his meal.  Does have a history of intermittent heartburn, but never took anything for this until recently starting Prilosec in the ER as above.  Heartburn worse with acidic foods.  He started with 40 mg daily and has had resolution of his heartburn.    No previous EGD or colonoscopy.   Has had some protrusion of right inguinal hernia, but never sxs.   Saw Cardiology last month and TTE completed yesterday for evaluation of SOB.     Past Medical History:  Diagnosis Date   History of Bell's palsy    History of kidney stones    UTI (urinary tract infection)      Past Surgical History:  Procedure Laterality Date   TONSILLECTOMY     Family History  Problem Relation Age of Onset   Heart disease  Brother    Liver disease Neg Hx    Esophageal cancer Neg Hx    Colon cancer Neg Hx    Social History   Tobacco Use   Smoking status: Never   Smokeless tobacco: Never  Vaping Use   Vaping Use: Never used  Substance Use Topics   Alcohol use: No   Drug use: No   Current Outpatient Medications  Medication Sig Dispense Refill   dextromethorphan (DELSYM) 30 MG/5ML liquid Take by mouth as needed for cough.     Misc Natural Products (URINOZINC PROSTATE CLASSIC) CAPS Take by mouth.     omeprazole (PRILOSEC) 20 MG capsule Take 1 capsule (20 mg total) by mouth 2 (two) times daily before a meal. 180 capsule 1   No current facility-administered medications for this visit.   No Known Allergies   Review of Systems: All systems reviewed and negative except where noted in HPI.     Physical Exam:    Wt Readings from Last 3 Encounters:  11/01/22 135 lb (61.2 kg)  10/11/22 130 lb (59 kg)  09/24/22 127 lb 12.8 oz (58 kg)    BP 118/76   Pulse (!) 45   Ht '5\' 7"'$  (1.702 m)   Wt 135 lb (61.2 kg)   SpO2 98%   BMI 21.14 kg/m  Constitutional:  Pleasant, in no acute distress. Psychiatric: Normal mood  and affect. Behavior is normal. Cardiovascular: Normal rate, regular rhythm. No edema Pulmonary/chest: Effort normal and breath sounds normal. No wheezing, rales or rhonchi. Abdominal: Soft, nondistended, nontender. Bowel sounds active throughout. There are no masses palpable. No hepatomegaly. Neurological: Alert and oriented to person place and time. Skin: Skin is warm and dry. No rashes noted.   ASSESSMENT AND PLAN;   1) GERD 2) Dysphagia 3) Hiatal hernia on CT Offered EGD to evaluate for erosive esophagitis, measure grade/severity of hernia, along with evaluation for etiology of dysphagia with esophageal dilation as appropriate.  His reflux symptoms have essentially resolved with omeprazole 40 mg daily, and he prefers to treat conservatively - Patient politely declines EGD - Continue  omeprazole 40 mg daily.  Can potentially reduce to 20 mg daily in the future depending on symptomatic response - To call me if symptoms worsen or he changes his mind and would like to proceed with EGD for diagnostic and potentially therapeutic intent.  If proceeding with EGD, would request Cardiology clearance as he is being actively evaluated in the Cardiology Clinic - Cut food into small pieces, chew thoroughly, and drink plenty of fluids with meals  4) Gastritis on CT - As above, patient declines EGD to evaluate for PUD, gastritis, etc. - Abdominal pain has resolved with Prilosec.  Patient prefers to treat medically and conservatively  5) Bilateral inguinal hernias - Patient politely declines referral to General Surgery  6) Atrophic pancreas on CT No known history of acute pancreatitis in the past.  Seemingly otherwise asymptomatic.  Briefly discussed CT findings and declines further evaluation at this juncture  7) Colon cancer screening Has never undergone CRC screening, but given age and lack of symptoms, he does not want to proceed with colonoscopy, which is a reasonable decision in this 87 year old gentleman  8) Shortness of breath - Continue follow-up in the Cardiology Clinic  RTC prn  Lavena Bullion, DO, [facility]  11/01/2022, 9:56 AM   Early, Coralee Pesa, NP

## 2022-11-02 MED ORDER — FUROSEMIDE 40 MG PO TABS: 40.0000 mg | ORAL_TABLET | Freq: Every day | ORAL | 3 refills | Status: DC

## 2022-11-02 NOTE — Telephone Encounter (Signed)
Please advise 

## 2022-11-02 NOTE — Telephone Encounter (Signed)
He only takes omeprazole, which actually lists peripheral edema as a possible side effect.  Review with Dr. Harrell Gave first, before consider stopping that

## 2022-11-02 NOTE — Telephone Encounter (Signed)
Please start lasix 40 mg daily for three days. It can then be used PRN for leg swelling. See mychart response as well. Thank you!

## 2022-11-05 ENCOUNTER — Ambulatory Visit (HOSPITAL_BASED_OUTPATIENT_CLINIC_OR_DEPARTMENT_OTHER): Payer: Medicare Other | Admitting: Cardiology

## 2022-11-11 ENCOUNTER — Encounter (HOSPITAL_BASED_OUTPATIENT_CLINIC_OR_DEPARTMENT_OTHER): Payer: Self-pay | Admitting: Cardiology

## 2022-11-13 ENCOUNTER — Ambulatory Visit (HOSPITAL_BASED_OUTPATIENT_CLINIC_OR_DEPARTMENT_OTHER): Payer: Medicare Other | Admitting: Cardiovascular Disease

## 2022-11-26 NOTE — Telephone Encounter (Signed)
Please advise 

## 2022-12-04 NOTE — Progress Notes (Signed)
Cardiology Office Note:    Date:  12/05/2022   ID:  David ClauseDonald W Behrens, DOB Jul 19, 1936, MRN [id number]  PCP:  Tollie EthEarly, Sara E, NP  Cardiologist:  Jodelle RedBridgette Haroldine Redler, MD  Referring MD: Tollie EthEarly, Sara E, NP   CC: follow up  History of Present Illness:    David [name] is a 87 y.o. male with no prior cardiac history who is seen for follow up today. He was initially seen on 10/11/22 at the request of Early, Sung AmabileSara E, NP for the evaluation and management of elevated troponin without MI.  Today, he is accompanied by his wife. He is overall feeling well. He reports his breathing has improved.   We reviewed the results of his recent echo.   He complains of bilateral LE edema and itchiness. He is compliant with 40 mg Lasix as needed and notes he is unsure if they are working. He reports having to use the bathroom frequently. His wife reports that he did not have edema when he had his echo about a month ago. He has been wearing compression socks to help. He previously had varicose veins and currently does not due to the swelling.   He denies any palpitations, chest pain, or shortness of breath. No lightheadedness, headaches, syncope, orthopnea, or PND.  Past Medical History:  Diagnosis Date   History of Bell's palsy    History of kidney stones    UTI (urinary tract infection)     Past Surgical History:  Procedure Laterality Date   TONSILLECTOMY      Current Medications: Current Outpatient Medications on File Prior to Visit  Medication Sig   dextromethorphan (DELSYM) 30 MG/5ML liquid Take by mouth as needed for cough.   furosemide (LASIX) 40 MG tablet Take 1 tablet (40 mg total) by mouth daily. Take daily for 3 days then use as needed for swelling, weight gain of 2 pounds overnight or 5 pounds in one week.   Misc Natural Products (URINOZINC PROSTATE CLASSIC) CAPS Take by mouth.   omeprazole (PRILOSEC) 20 MG capsule Take 1 capsule (20 mg total) by mouth 2 (two) times daily before a meal.    No current facility-administered medications on file prior to visit.     Allergies:   Patient has no known allergies.   Social History   Tobacco Use   Smoking status: Never   Smokeless tobacco: Never  Vaping Use   Vaping Use: Never used  Substance Use Topics   Alcohol use: No   Drug use: No    Family History: family history includes Heart disease in his brother. There is no history of Liver disease, Esophageal cancer, or Colon cancer.  ROS:   Please see the history of present illness.  (+) Bilateral LE edema (+) Itchiness (legs) Additional pertinent ROS otherwise unremarkable  EKGs/Labs/Other Studies Reviewed:    The following studies were reviewed today:  Echo 10/31/2022: IMPRESSIONS   1. Left ventricular ejection fraction, by estimation, is 55 to 60%. The  left ventricle has normal function. The left ventricle has no regional  wall motion abnormalities. Left ventricular diastolic parameters are  indeterminate.   2. Right ventricular systolic function is normal. The right ventricular  size is normal. There is moderately elevated pulmonary artery systolic  pressure.   3. Left atrial size was moderately dilated.   4. The mitral valve is grossly normal. Mild to moderate mitral valve  regurgitation.   5. The aortic valve is tricuspid. There is mild calcification of the  aortic  valve. There is mild thickening of the aortic valve. Aortic valve  regurgitation is mild to moderate. Aortic valve sclerosis/calcification is  present, without any evidence of aortic stenosis.   6. Aortic dilatation noted. There is borderline dilatation of the aortic  root, measuring 40 mm. There is mild dilatation of the ascending aorta, measuring 41 mm.   7. The inferior vena cava is dilated in size with >50% respiratory  variability, suggesting right atrial pressure of 8 mmHg.   8. Frequent PVCs throughout the study.    EKG:  EKG is personally reviewed.   12/05/2022: not ordered  today 10/11/2022: The EKG is not ordered.  Recent Labs: 09/18/2022: ALT 20; BUN 25; Creatinine, Ser 0.95; Potassium 4.3; Sodium 138 09/24/2022: BNP 108.3; Hemoglobin 14.8; Platelets 172  Recent Lipid Panel No results found for: "CHOL", "TRIG", "HDL", "CHOLHDL", "VLDL", "LDLCALC", "LDLDIRECT"  Physical Exam:    VS:  BP (!) 150/80   Pulse 68   Ht 5\' 7"  (1.702 m)   Wt 131 lb (59.4 kg)   BMI 20.52 kg/m     Wt Readings from Last 3 Encounters:  12/05/22 131 lb (59.4 kg)  11/01/22 135 lb (61.2 kg)  10/11/22 130 lb (59 kg)    GEN: Well nourished, well developed in no acute distress HEENT: Normal, moist mucous membranes NECK: No JVD CARDIAC: regular rhythm, normal S1 and S2, no rubs or gallops. (1/6) murmur. S3 abnormality  VASCULAR: Radial and DP pulses 2+ bilaterally. No carotid bruits RESPIRATORY:  Clear to auscultation without rales, wheezing or rhonchi  ABDOMEN: Soft, non-tender, non-distended MUSCULOSKELETAL:  Ambulates independently SKIN: Warm and dry, no edema NEUROLOGIC:  Alert and oriented x 3. No focal neuro deficits noted. PSYCHIATRIC:  Normal affect    ASSESSMENT:    1. DOE (dyspnea on exertion)   2. Aortic atherosclerosis (HCC)   3. Cardiac risk counseling   4. Encounter to discuss test results     PLAN:    Dyspnea on exertion History of elevated troponin -echo reviewed at length today. E 55-60%, indeterminate diastolic function, normal RVSP, PASP moderately elevated, RAP 8 mmhgmild-moderate MR, mild-moderate AR. Ascending aorta 41 mm -will check a BMET when he can return given lasix use -discussed options. He would like to be conservative. If his swelling/symptoms worsen, he would contact us. Otherwise he would like to avoid testing if possible. -recommended compression stockings, checking weights daily -reviewed red flag warning signs that need immediate medical attention  Aortic atherosclerosis CV risk counseling -given age, defers  aspirin/statin  RBBB -asymptomatic  Plan for follow up: I would be happy to see him back as needed.   Jodelle Red, MD, PhD, [facility]  [facility]  [place]  [facility] & V[facility] at [facility] at [facility] [address], [address] [place], [place] [address] [phone]   Medication Adjustments/Labs and Tests Ordered: Current medicines are reviewed at length with the patient today.  Concerns regarding medicines are outlined above.  Orders Placed This Encounter  Procedures   Basic Metabolic Panel (BMET)   No orders of the defined types were placed in this encounter.   Patient Instructions  Medication Instructions:  Lasix daily  *If you need a refill on your cardiac medications before your next appointment, please call your pharmacy*   Lab Work: BMET when you can return  I Testing/Procedures: N/A   Follow-Up: At [facility], you and your health needs are our priority.  As part of our continuing mission to provide  you with exceptional heart care, we have created designated Provider Care Teams.  These Care Teams include your primary Cardiologist (physician) and Advanced Practice Providers (APPs -  Physician Assistants and Nurse Practitioners) who all work together to provide you with the care you need, when you need it.  We recommend signing up for the patient portal called "MyChart".  Sign up information is provided on this After Visit Summary.  MyChart is used to connect with patients for Virtual Visits (Telemedicine).  Patients are able to view lab/test results, encounter notes, upcoming appointments, etc.  Non-urgent messages can be sent to your provider as well.   To learn more about what you can do with MyChart, go to [email or web address].    Your next appointment:   PRN (as needed)  Provider:   Jodelle Red, MD    Other Instructions Look into compression stockings. Check weights daily.      I,Rachel Rivera,acting as a Neurosurgeon for [employer], MD.,have documented all relevant documentation on the behalf of Jodelle Red, MD,as directed by  Jodelle Red, MD while in the presence of Jodelle Red, MD.  I, Jodelle Red, MD, have reviewed all documentation for this visit. The documentation on 12/20/22 for the exam, diagnosis, procedures, and orders are all accurate and complete.

## 2022-12-05 ENCOUNTER — Encounter (HOSPITAL_BASED_OUTPATIENT_CLINIC_OR_DEPARTMENT_OTHER): Payer: Self-pay | Admitting: Cardiology

## 2022-12-05 ENCOUNTER — Ambulatory Visit (HOSPITAL_BASED_OUTPATIENT_CLINIC_OR_DEPARTMENT_OTHER): Payer: Medicare Other | Admitting: Cardiology

## 2022-12-05 VITALS — BP 150/80 | HR 68 | Ht 67.0 in | Wt 131.0 lb

## 2022-12-05 DIAGNOSIS — I700 Atherosclerosis of aorta: Secondary | ICD-10-CM

## 2022-12-05 DIAGNOSIS — Z712 Person consulting for explanation of examination or test findings: Secondary | ICD-10-CM

## 2022-12-05 DIAGNOSIS — R0609 Other forms of dyspnea: Secondary | ICD-10-CM | POA: Diagnosis not present

## 2022-12-05 DIAGNOSIS — Z7189 Other specified counseling: Secondary | ICD-10-CM

## 2022-12-05 NOTE — Patient Instructions (Addendum)
Medication Instructions:  Lasix daily  *If you need a refill on your cardiac medications before your next appointment, please call your pharmacy*   Lab Work: BMET when you can return  I Testing/Procedures: N/A   Follow-Up: At [facility], you and your health needs are our priority.  As part of our continuing mission to provide you with exceptional heart care, we have created designated Provider Care Teams.  These Care Teams include your primary Cardiologist (physician) and Advanced Practice Providers (APPs -  Physician Assistants and Nurse Practitioners) who all work together to provide you with the care you need, when you need it.  We recommend signing up for the patient portal called "MyChart".  Sign up information is provided on this After Visit Summary.  MyChart is used to connect with patients for Virtual Visits (Telemedicine).  Patients are able to view lab/test results, encounter notes, upcoming appointments, etc.  Non-urgent messages can be sent to your provider as well.   To learn more about what you can do with MyChart, go to [email or web address].    Your next appointment:   PRN (as needed)  Provider:   Jodelle Red, MD    Other Instructions Look into compression stockings. Check weights daily.

## 2022-12-17 ENCOUNTER — Emergency Department (HOSPITAL_BASED_OUTPATIENT_CLINIC_OR_DEPARTMENT_OTHER)
Admission: EM | Admit: 2022-12-17 | Discharge: 2022-12-17 | Disposition: A | Discharge status: Home / Self Care | Payer: Medicare Other | Attending: Emergency Medicine | Admitting: Emergency Medicine

## 2022-12-17 ENCOUNTER — Encounter (HOSPITAL_BASED_OUTPATIENT_CLINIC_OR_DEPARTMENT_OTHER): Payer: Self-pay | Admitting: Emergency Medicine

## 2022-12-17 ENCOUNTER — Emergency Department (HOSPITAL_BASED_OUTPATIENT_CLINIC_OR_DEPARTMENT_OTHER): Payer: Medicare Other | Admitting: Radiology

## 2022-12-17 ENCOUNTER — Other Ambulatory Visit: Payer: Self-pay

## 2022-12-17 ENCOUNTER — Encounter (HOSPITAL_BASED_OUTPATIENT_CLINIC_OR_DEPARTMENT_OTHER): Payer: Self-pay

## 2022-12-17 DIAGNOSIS — R7989 Other specified abnormal findings of blood chemistry: Secondary | ICD-10-CM | POA: Insufficient documentation

## 2022-12-17 DIAGNOSIS — R079 Chest pain, unspecified: Secondary | ICD-10-CM | POA: Diagnosis not present

## 2022-12-17 DIAGNOSIS — R0602 Shortness of breath: Secondary | ICD-10-CM | POA: Diagnosis not present

## 2022-12-17 DIAGNOSIS — R109 Unspecified abdominal pain: Secondary | ICD-10-CM | POA: Diagnosis not present

## 2022-12-17 DIAGNOSIS — Z87891 Personal history of nicotine dependence: Secondary | ICD-10-CM | POA: Diagnosis not present

## 2022-12-17 DIAGNOSIS — J439 Emphysema, unspecified: Secondary | ICD-10-CM | POA: Diagnosis not present

## 2022-12-17 DIAGNOSIS — J441 Chronic obstructive pulmonary disease with (acute) exacerbation: Secondary | ICD-10-CM | POA: Diagnosis not present

## 2022-12-17 HISTORY — DX: Calculus of kidney: N20.0

## 2022-12-17 HISTORY — DX: Bell's palsy: G51.0

## 2022-12-17 LAB — DIFFERENTIAL
Abs Immature Granulocytes: 0.03 10*3/uL (ref 0.00–0.07)
Basophils Absolute: 0.1 10*3/uL (ref 0.0–0.1)
Basophils Relative: 1 %
Eosinophils Absolute: 0.4 10*3/uL (ref 0.0–0.5)
Eosinophils Relative: 4 %
Immature Granulocytes: 0 %
Lymphocytes Relative: 19 %
Lymphs Abs: 1.6 10*3/uL (ref 0.7–4.0)
Monocytes Absolute: 0.6 10*3/uL (ref 0.1–1.0)
Monocytes Relative: 7 %
Neutro Abs: 6 10*3/uL (ref 1.7–7.7)
Neutrophils Relative %: 69 %

## 2022-12-17 LAB — BASIC METABOLIC PANEL
Anion gap: 10 (ref 5–15)
BUN: 29 mg/dL — ABNORMAL HIGH (ref 8–23)
CO2: 26 mmol/L (ref 22–32)
Calcium: 9.4 mg/dL (ref 8.9–10.3)
Chloride: 105 mmol/L (ref 98–111)
Creatinine, Ser: 1.12 mg/dL (ref 0.61–1.24)
GFR, Estimated: >60 mL/min (ref >60)
Glucose, Bld: 96 mg/dL (ref 70–99)
Potassium: 4.5 mmol/L (ref 3.5–5.1)
Sodium: 141 mmol/L (ref 135–145)

## 2022-12-17 LAB — CBC
HCT: 45 % (ref 39.0–52.0)
Hemoglobin: 14.7 g/dL (ref 13.0–17.0)
MCH: 29.1 pg (ref 26.0–34.0)
MCHC: 32.7 g/dL (ref 30.0–36.0)
MCV: 88.9 fL (ref 80.0–100.0)
Platelets: 193 10*3/uL (ref 150–400)
RBC: 5.06 MIL/uL (ref 4.22–5.81)
RDW: 14.5 % (ref 11.5–15.5)
WBC: 8.1 10*3/uL (ref 4.0–10.5)
nRBC: 0 % (ref 0.0–0.2)

## 2022-12-17 LAB — BRAIN NATRIURETIC PEPTIDE: B Natriuretic Peptide: 183.1 pg/mL — ABNORMAL HIGH (ref 0.0–100.0)

## 2022-12-17 MED ORDER — AEROCHAMBER PLUS FLO-VU MISC
1.0000 | Freq: Once | Status: AC
  Administered 2022-12-17: 1
  Filled 2022-12-17: qty 1

## 2022-12-17 MED ORDER — ALBUTEROL SULFATE HFA 108 (90 BASE) MCG/ACT IN AERS
2.0000 | INHALATION_SPRAY | RESPIRATORY_TRACT | Status: DC | PRN
  Administered 2022-12-17: 2 via RESPIRATORY_TRACT
  Filled 2022-12-17: qty 6.7

## 2022-12-17 NOTE — ED Triage Notes (Signed)
Pt arrives via POV d/t concerns for persistent SOB over the past few days worsening over the past few days. Pt states SOB ongoing, not related to exertion. Endorses compliance with prescribed lasix without improvement of swelling. No cough/fevers/chills. Denies CP.

## 2022-12-17 NOTE — ED Provider Notes (Signed)
[facility] Provider Note: David Dell, MD, FACEP  CSN: [id number] MRN: [id number] ARRIVAL: 12/17/22 at 1926 ROOM: DB014/DB014   CHIEF COMPLAINT  Shortness of Breath   HISTORY OF PRESENT ILLNESS  12/17/22 11:06 PM David [name] is a 87 y.o. male who has had shortness of breath for the past several months.  It has been gradually worsening over the past several days.  It comes and goes.  Sometimes during the day he will be fine and sometimes during the day he feels he cannot catch his breath.  It is not related to exertion.  It is not associated with shortness of breath or cough.  He has not had a fever or chills.  He does have peripheral edema for which he takes Lasix.  He has been seen by cardiology, PCP, and the [facility] long ED without a clear answer.  He has not seen a pulmonologist.   Past Medical History:  Diagnosis Date   Bell's palsy    Kidney stones    UTI (urinary tract infection)     Past Surgical History:  Procedure Laterality Date   TONSILLECTOMY      Family History  Problem Relation Age of Onset   Heart disease Brother    Liver disease Neg Hx    Esophageal cancer Neg Hx    Colon cancer Neg Hx     Social History   Tobacco Use   Smoking status: Never   Smokeless tobacco: Never  Vaping Use   Vaping Use: Never used  Substance Use Topics   Alcohol use: No   Drug use: No    Prior to Admission medications   Medication Sig Start Date End Date Taking? Authorizing Provider  dextromethorphan (DELSYM) 30 MG/5ML liquid Take by mouth as needed for cough.    [provider]  furosemide (LASIX) 40 MG tablet Take 1 tablet (40 mg total) by mouth daily. Take daily for 3 days then use as needed for swelling, weight gain of 2 pounds overnight or 5 pounds in one week. 11/02/22 10/28/23  Jodelle Red, MD  Misc Natural Products [facility] PROSTATE CLASSIC) CAPS Take by mouth.    [provider]  omeprazole (PRILOSEC) 20 MG capsule Take 1  capsule (20 mg total) by mouth 2 (two) times daily before a meal. 09/24/22   Early, Sung Amabile, NP    Allergies Patient has no known allergies.   REVIEW OF SYSTEMS  Negative except as noted here or in the History of Present Illness.   PHYSICAL EXAMINATION  Initial Vital Signs Blood pressure (!) 178/84, pulse 77, temperature 97.7 F (36.5 C), temperature source Oral, resp. rate 15, height 5\' 7"  (1.702 m), weight 59.1 kg, SpO2 99 %.  Examination General: Well-developed, well-nourished male in no acute distress; appearance consistent with age of record HENT: normocephalic; atraumatic Eyes: pupils equal, round and reactive to light; extraocular muscles intact lateral pseudophakia Neck: supple Heart: regular rate and rhythm with frequent PVCs Lungs: clear to auscultation bilaterally Abdomen: soft; nondistended; nontender; bowel sounds present Back: Thoracic kyphosis Extremities: No deformity; full range of motion; edema of lower legs Neurologic: Awake, alert; motor function intact in all extremities and symmetric; no facial droop Skin: Warm and dry Psychiatric: Normal mood and affect   RESULTS  Summary of this visit's results, reviewed and interpreted by myself:   EKG Interpretation  Date/Time:  Monday December 17 2022 19:41:28 EDT Ventricular Rate:  82 PR Interval:  166 QRS Duration: 136 QT Interval:  426  QTC Calculation: 497 R Axis:   78 Text Interpretation: Sinus rhythm with frequent Premature ventricular complexes Possible Left atrial enlargement Right bundle branch block Abnormal ECG No significant change was found Confirmed by Barnett Elzey ([id number]) on 12/17/2022 11:09:22 PM       Laboratory Studies: Results for orders placed or performed during the hospital encounter of 12/17/22 (from the past 24 hour(s))  Basic metabolic panel     Status: Abnormal   Collection Time: 12/17/22  7:44 PM  Result Value Ref Range   Sodium 141 135 - 145 mmol/L   Potassium 4.5 3.5 - 5.1 mmol/L    Chloride 105 98 - 111 mmol/L   CO2 26 22 - 32 mmol/L   Glucose, Bld 96 70 - 99 mg/dL   BUN 29 (H) 8 - 23 mg/dL   Creatinine, Ser 8.11 0.61 - 1.24 mg/dL   Calcium 9.4 8.9 - 91.4 mg/dL   GFR, Estimated >78 >29 mL/min   Anion gap 10 5 - 15  CBC     Status: None   Collection Time: 12/17/22  7:44 PM  Result Value Ref Range   WBC 8.1 4.0 - 10.5 K/uL   RBC 5.06 4.22 - 5.81 MIL/uL   Hemoglobin 14.7 13.0 - 17.0 g/dL   HCT 56.2 13.0 - 86.5 %   MCV 88.9 80.0 - 100.0 fL   MCH 29.1 26.0 - 34.0 pg   MCHC 32.7 30.0 - 36.0 g/dL   RDW 78.4 69.6 - 29.5 %   Platelets 193 150 - 400 K/uL   nRBC 0.0 0.0 - 0.2 %  Brain natriuretic peptide     Status: Abnormal   Collection Time: 12/17/22  7:44 PM  Result Value Ref Range   B Natriuretic Peptide 183.1 (H) 0.0 - 100.0 pg/mL  Differential     Status: None   Collection Time: 12/17/22  7:44 PM  Result Value Ref Range   Neutrophils Relative % 69 %   Neutro Abs 6.0 1.7 - 7.7 K/uL   Lymphocytes Relative 19 %   Lymphs Abs 1.6 0.7 - 4.0 K/uL   Monocytes Relative 7 %   Monocytes Absolute 0.6 0.1 - 1.0 K/uL   Eosinophils Relative 4 %   Eosinophils Absolute 0.4 0.0 - 0.5 K/uL   Basophils Relative 1 %   Basophils Absolute 0.1 0.0 - 0.1 K/uL   Immature Granulocytes 0 %   Abs Immature Granulocytes 0.03 0.00 - 0.07 K/uL   Imaging Studies: DG Chest 2 View  Result Date: 12/17/2022 CLINICAL DATA:  Shortness of breath, acid reflux, and stomach pain for 2 months but worse over the last few days. EXAM: CHEST - 2 VIEW COMPARISON:  09/18/2022 FINDINGS: Heart size and pulmonary vascularity are normal. Emphysematous changes and scarring in the lungs. Peribronchial thickening with streaky perihilar opacities consistent with chronic bronchitis. No consolidation or edema. No pleural effusions. No pneumothorax. Mediastinal contours appear intact. Tortuous aorta. Degenerative changes in the spine. IMPRESSION: Emphysematous and chronic bronchitic changes in the lungs. No focal  consolidation. Electronically Signed   By: Burman Nieves M.D.   On: 12/17/2022 20:26    ED COURSE and MDM  Nursing notes, initial and subsequent vitals signs, including pulse oximetry, reviewed and interpreted by myself.  Vitals:   12/17/22 2230 12/17/22 2245 12/17/22 2329 12/17/22 2330  BP: (!) 178/84   (!) 195/71  Pulse: 77 77    Resp: Temp:      TempSrc:  SpO2: 99% 98% 100%   Weight:      Height:       Medications  albuterol (VENTOLIN HFA) 108 (90 Base) MCG/ACT inhaler 2 puff (2 puffs Inhalation Given 12/17/22 2329)  aerochamber plus with mask device 1 each (1 each Other Given 12/17/22 2329)   The patient's BNP is only mildly elevated and is showing no evidence of pulmonary edema on chest x-ray.  His oxygen saturation is 100% and his respiratory rate is normal as well.  He has no accessory muscle use.  Chest x-ray shows emphysematous and chronic bronchitic changes in the lungs.  I suspect his shortness of breath is due to COPD.  His significant kyphosis may also be contributing.  He is a former smoker.  He has never used an inhaler nor been told that he had COPD.  We have provided him an albuterol inhaler and AeroChamber and instructed him in their use.  Even though he is asymptomatic with normal exam in the ED it sounds like he is having episodes of shortness of breath.  He was advised to use his albuterol inhaler during these episodes.  We will also refer him to a pulmonologist for further evaluation.    PROCEDURES  Procedures   ED DIAGNOSES     ICD-10-CM   1. COPD exacerbation  J44.1          Sinan Tuch, MD 12/17/22 [phone]

## 2022-12-17 NOTE — ED Notes (Addendum)
Pt ambulated to room from lobby. SpO2 92% on initial assessment

## 2022-12-20 ENCOUNTER — Telehealth: Payer: Self-pay | Admitting: *Deleted

## 2022-12-20 ENCOUNTER — Encounter (HOSPITAL_BASED_OUTPATIENT_CLINIC_OR_DEPARTMENT_OTHER): Payer: Self-pay | Admitting: Cardiology

## 2022-12-20 NOTE — Telephone Encounter (Signed)
        Patient  visited [facility] on 12/17/2022  for treatment   Telephone encounter attempt :  1st  A HIPAA compliant voice message was left requesting a return call.  Instructed patient to call back at [phone].  Yehuda Mao Greenauer -[facility] [place], Population Health [phone] [address] [place] , [place] [address] Email : [email or web address]

## 2022-12-21 ENCOUNTER — Telehealth: Payer: Self-pay | Admitting: *Deleted

## 2022-12-21 NOTE — Telephone Encounter (Signed)
        Patient  visited [facility] on 12/17/2022  for treatment    Telephone encounter attempt :  2nd  A HIPAA compliant voice message was left requesting a return call.  Instructed patient to call back at [phone].  Yehuda Mao Greenauer -[facility] [place], Population Health [phone] [address] [place] , [place] [address] Email : [email or web address]

## 2023-01-07 ENCOUNTER — Telehealth: Payer: Self-pay | Admitting: Nurse Practitioner

## 2023-01-07 NOTE — Telephone Encounter (Signed)
Called patient to schedule Medicare Annual Wellness Visit (AWV). Left message for patient to call back and schedule Medicare Annual Wellness Visit (AWV).  Last date of AWV: 02/18/20  Please schedule an appointment at any time with [facility].  If any questions, please contact me at [phone].  Thank you ,  Malon Siddall [facility] AWV direct phone # [phone] 

## 2023-01-09 ENCOUNTER — Telehealth: Payer: Self-pay | Admitting: Nurse Practitioner

## 2023-01-09 NOTE — Telephone Encounter (Signed)
Called patient to schedule Medicare Annual Wellness Visit (AWV). Left message for patient to call back and schedule Medicare Annual Wellness Visit (AWV).  Last date of AWV: 02/18/20  Please schedule an appointment at any time with [facility].  If any questions, please contact me at [phone].  Thank you ,  Rudell Cobb AWV direct phone # [phone]

## 2023-01-28 ENCOUNTER — Other Ambulatory Visit (HOSPITAL_COMMUNITY): Payer: Self-pay

## 2023-01-28 MED ORDER — FUROSEMIDE 40 MG PO TABS
40.0000 mg | ORAL_TABLET | Freq: Every day | ORAL | 2 refills | Status: DC
  Filled 2023-01-28: qty 90, 90d supply, fill #0
  Filled 2023-06-14: qty 90, 90d supply, fill #1

## 2023-01-29 ENCOUNTER — Other Ambulatory Visit: Payer: Self-pay

## 2023-03-25 ENCOUNTER — Ambulatory Visit: Payer: Medicare Other | Admitting: Nurse Practitioner

## 2023-06-14 ENCOUNTER — Other Ambulatory Visit (HOSPITAL_COMMUNITY): Payer: Self-pay

## 2023-07-29 ENCOUNTER — Encounter: Payer: Self-pay | Admitting: Nurse Practitioner

## 2023-08-02 ENCOUNTER — Ambulatory Visit (HOSPITAL_BASED_OUTPATIENT_CLINIC_OR_DEPARTMENT_OTHER): Payer: Medicare Other | Admitting: Family Medicine

## 2023-08-02 ENCOUNTER — Encounter (HOSPITAL_BASED_OUTPATIENT_CLINIC_OR_DEPARTMENT_OTHER): Payer: Self-pay | Admitting: Family Medicine

## 2023-08-02 VITALS — BP 160/70 | HR 75 | Ht 67.0 in | Wt 129.8 lb

## 2023-08-02 DIAGNOSIS — I700 Atherosclerosis of aorta: Secondary | ICD-10-CM

## 2023-08-02 DIAGNOSIS — R0602 Shortness of breath: Secondary | ICD-10-CM | POA: Diagnosis not present

## 2023-08-02 DIAGNOSIS — I4510 Unspecified right bundle-branch block: Secondary | ICD-10-CM | POA: Diagnosis not present

## 2023-08-02 DIAGNOSIS — Z23 Encounter for immunization: Secondary | ICD-10-CM

## 2023-08-02 MED ORDER — ALBUTEROL SULFATE HFA 108 (90 BASE) MCG/ACT IN AERS
2.0000 | INHALATION_SPRAY | Freq: Four times a day (QID) | RESPIRATORY_TRACT | 3 refills | Status: DC | PRN
  Filled 2023-08-05: qty 18, 20d supply, fill #0
  Filled 2023-08-05: qty 18, 25d supply, fill #0
  Filled 2023-08-05: qty 6.7, 25d supply, fill #0

## 2023-08-02 NOTE — Progress Notes (Signed)
New Patient Office Visit  Subjective    Patient ID: David [name], male    DOB: Dec 01, 1935  Age: 87 y.o. MRN: [id number]  CC:  Chief Complaint  Patient presents with   Shortness of Breath    Constantly, does have a cardiologist. Would like a pulm referral   Chills    Feels like he stays cold all the time   HPI David [name] is a 87 year old male who presents to establish with Primary Care & Sports Medicine at [facility].  Former PCP: [facility]- Hetty Blend, NP Last visit was in 2021, appears that he usually utilizes the ED for acute concerns.  He has concerns about ongoing shortness of breath. Reports this occurs both at rest and with exertion- he states that the "no doctors can figure out what is wrong with me"   He was seen by cardiology (Dr. Cristal Deer) in 11/2022 and echo results were reviewed. LVEF 55-60% with normal function. Frequent PVCs noted throughout study.   Reports the shortness of breath comes and goes- is not always with exertion.  Currently taking lasix 40mg  daily  Has been using albuterol- unable to state how much he is using and how frequently he is using it. Patient reports he is a former smoker for about 1 year of his life- was not heavy smoker.   Outpatient Encounter Medications as of 08/02/2023  Medication Sig   dextromethorphan (DELSYM) 30 MG/5ML liquid Take by mouth as needed for cough.   furosemide (LASIX) 40 MG tablet Take 1 tablet (40 mg total) by mouth daily. Take daily for 3 days then use as needed for swelling, weight gain of 2 pounds overnight or 5 pounds in one week.   furosemide (LASIX) 40 MG tablet Take 1 tablet (40 mg total) by mouth daily for 3 days, then take 1 daily if needed  for swelling or fluid/weight gain of 2 pounds overnight or 5 pounds in a week.   omeprazole (PRILOSEC) 20 MG capsule Take 1 capsule (20 mg total) by mouth 2 (two) times daily before a meal.   [DISCONTINUED] albuterol  (VENTOLIN HFA) 108 (90 Base) MCG/ACT inhaler Inhale into the lungs every 6 (six) hours as needed for wheezing or shortness of breath.   albuterol (VENTOLIN HFA) 108 (90 Base) MCG/ACT inhaler Inhale 2 puffs into the lungs every 6 (six) hours as needed for wheezing or shortness of breath.   [DISCONTINUED] Misc Natural Products (URINOZINC PROSTATE CLASSIC) CAPS Take by mouth. (Patient not taking: Reported on 08/02/2023)   No facility-administered encounter medications on file as of 08/02/2023.   Past Medical History:  Diagnosis Date   Bell's palsy    Kidney stones    UTI (urinary tract infection)     Past Surgical History:  Procedure Laterality Date   TONSILLECTOMY      Family History  Problem Relation Age of Onset   Heart disease Brother    Liver disease Neg Hx    Esophageal cancer Neg Hx    Colon cancer Neg Hx    Review of Systems  Constitutional:  Negative for malaise/fatigue and weight loss.  HENT:  Positive for hearing loss (HOH).   Respiratory:  Positive for shortness of breath. Negative for cough and wheezing.   Cardiovascular:  Positive for leg swelling (intermittent). Negative for chest pain, palpitations, orthopnea, claudication and PND.  Gastrointestinal:  Negative for abdominal pain, nausea and vomiting.  Neurological:  Negative for dizziness, speech change, focal weakness  and headaches.  Psychiatric/Behavioral:  Negative for depression, memory loss and suicidal ideas. The patient is not nervous/anxious.     Objective    BP (!) 160/70   Pulse 75   Ht 5\' 7"  (1.702 m)   Wt 129 lb 12.8 oz (58.9 kg)   SpO2 99%   BMI 20.33 kg/m   Physical Exam Vitals reviewed.  Constitutional:      Appearance: Normal appearance. He is well-developed.  Cardiovascular:     Rate and Rhythm: Normal rate and regular rhythm. Frequent Extrasystoles are present.    Pulses: Normal pulses.     Heart sounds:     Gallop present. S3 sounds present.  Pulmonary:     Effort: Pulmonary effort  is normal.     Breath sounds: Normal breath sounds.  Musculoskeletal:     Right lower leg: Edema (non-pitting) present.     Left lower leg: Edema (non-pitting) present.  Neurological:     Mental Status: He is alert.  Psychiatric:        Mood and Affect: Mood normal.        Behavior: Behavior normal.     Assessment & Plan:   1. Shortness of breath Patient presents today with complaints of ongoing shortness of breath with rest and exertion. He reports he frequently has lower extremity edema and is taking furosemide on a daily basis. He ambulates independently and participates in daily activity of various types. BP slightly elevated today in office. Patient in no acute distress and is able to answer questions appropriately. Denies palpitations, lightheadedness, syncope, orthopnea, PND, chest pain, current shob, vision changes, or headaches. Cardiovascular exam with S3 gallop present. Non-pitting bilateral lower extremity edema present. Lungs clear to auscultation bilaterally, without crackles, rales, rhonchi, or wheezing. Patient has had full work-up with cardiology with no definitive answers regarding his shortness of breath. Will place referral to pulmonology for further assessment. Refill provided for albuterol inhaler. - Ambulatory referral to Pulmonology  2. Aortic atherosclerosis (HCC) Noted on abdominal CT scan from 09/18/2022. CV risk counseling provided by cardiology- due to age, defers aspirin/statin.   3. Right bundle branch block (RBBB) on electrocardiogram (ECG) Reviewed previous EKGs and Dr. Di Kindle note from 12/05/2022. Patient has a history of an asymptomatic RBBB. EKG from 12/19/2022 with frequent PVCs. Cardiac exam with S3 gallop present.   4. Encounter for immunization Patient would like to receive influenza immunization today.  - Flu Vaccine Trivalent High Dose (Fluad)   Return in about 2 months (around 10/03/2023) for eval of shob & elevated BP .   Spent 60 minutes  on this patient encounter, including preparation, chart review, face-to-face counseling with patient and coordination of care, and documentation of encounter.    Alyson Reedy, FNP

## 2023-08-05 ENCOUNTER — Other Ambulatory Visit (HOSPITAL_COMMUNITY): Payer: Self-pay

## 2023-08-06 ENCOUNTER — Encounter (HOSPITAL_BASED_OUTPATIENT_CLINIC_OR_DEPARTMENT_OTHER): Payer: Self-pay

## 2023-08-14 ENCOUNTER — Ambulatory Visit (HOSPITAL_BASED_OUTPATIENT_CLINIC_OR_DEPARTMENT_OTHER): Payer: Medicare Other | Admitting: Family Medicine

## 2023-08-15 ENCOUNTER — Encounter (HOSPITAL_BASED_OUTPATIENT_CLINIC_OR_DEPARTMENT_OTHER): Payer: Self-pay | Admitting: Family Medicine

## 2023-08-15 ENCOUNTER — Other Ambulatory Visit (HOSPITAL_BASED_OUTPATIENT_CLINIC_OR_DEPARTMENT_OTHER): Payer: Self-pay

## 2023-08-15 MED ORDER — ALBUTEROL SULFATE (2.5 MG/3ML) 0.083% IN NEBU
2.5000 mg | INHALATION_SOLUTION | Freq: Four times a day (QID) | RESPIRATORY_TRACT | 1 refills | Status: DC | PRN
  Filled 2023-08-15: qty 150, 13d supply, fill #0

## 2023-08-15 NOTE — Telephone Encounter (Signed)
Spoke with patients daughter over the phone, she did call EMS to come out and check him. They did a repeat EKG, gave him a breathing treatment she feels patient has improved a little bit. She will start him on breztri tomorrow.

## 2023-08-26 ENCOUNTER — Other Ambulatory Visit (HOSPITAL_BASED_OUTPATIENT_CLINIC_OR_DEPARTMENT_OTHER): Payer: Self-pay

## 2023-08-26 ENCOUNTER — Encounter (HOSPITAL_BASED_OUTPATIENT_CLINIC_OR_DEPARTMENT_OTHER): Payer: Self-pay | Admitting: Family Medicine

## 2023-08-26 ENCOUNTER — Other Ambulatory Visit (HOSPITAL_BASED_OUTPATIENT_CLINIC_OR_DEPARTMENT_OTHER): Payer: Self-pay | Admitting: Family Medicine

## 2023-08-26 ENCOUNTER — Ambulatory Visit (HOSPITAL_BASED_OUTPATIENT_CLINIC_OR_DEPARTMENT_OTHER): Payer: Medicare Other | Admitting: Family Medicine

## 2023-08-26 ENCOUNTER — Ambulatory Visit (INDEPENDENT_AMBULATORY_CARE_PROVIDER_SITE_OTHER): Payer: Medicare Other

## 2023-08-26 VITALS — BP 140/66 | HR 41 | Wt 122.8 lb

## 2023-08-26 DIAGNOSIS — R600 Localized edema: Secondary | ICD-10-CM | POA: Diagnosis not present

## 2023-08-26 DIAGNOSIS — J4489 Other specified chronic obstructive pulmonary disease: Secondary | ICD-10-CM

## 2023-08-26 DIAGNOSIS — I700 Atherosclerosis of aorta: Secondary | ICD-10-CM | POA: Diagnosis not present

## 2023-08-26 DIAGNOSIS — R0609 Other forms of dyspnea: Secondary | ICD-10-CM

## 2023-08-26 DIAGNOSIS — J209 Acute bronchitis, unspecified: Secondary | ICD-10-CM

## 2023-08-26 DIAGNOSIS — R918 Other nonspecific abnormal finding of lung field: Secondary | ICD-10-CM | POA: Diagnosis not present

## 2023-08-26 DIAGNOSIS — R0989 Other specified symptoms and signs involving the circulatory and respiratory systems: Secondary | ICD-10-CM

## 2023-08-26 DIAGNOSIS — R0602 Shortness of breath: Secondary | ICD-10-CM | POA: Diagnosis not present

## 2023-08-26 MED ORDER — IPRATROPIUM-ALBUTEROL 0.5-2.5 (3) MG/3ML IN SOLN
3.0000 mL | RESPIRATORY_TRACT | 1 refills | Status: DC | PRN
  Filled 2023-08-26: qty 90, 5d supply, fill #0

## 2023-08-26 MED ORDER — TRELEGY ELLIPTA 100-62.5-25 MCG/ACT IN AEPB: 1.0000 | INHALATION_SPRAY | Freq: Every day | RESPIRATORY_TRACT | 5 refills | Status: DC

## 2023-08-26 NOTE — Progress Notes (Signed)
Acute Office Visit  Subjective:    Patient ID: David [name], male    DOB: May 11, 1936, 87 y.o.   MRN: [id number]  Chief Complaint  Patient presents with   Leg Swelling    Bilateral leg swelling, did 80mg  of lasix for 3-4 days, back to taking 40mg  legs still swollen   Shortness of Breath    Getting worse, denies chest pain/pressure, pain is in his lower stomach   David [name] is a 87 year old male patient who presents today for ongoing shortness of breath. He presents to the visit with his daughter and is able to answer all questions appropriately. He reports since his last visit, his breathing continues to become labored at various times and then states that it returns to normal. Patient states "some days it is okay but the last few days, all of a sudden, it gets worse." He does report bilateral leg swelling and shob with exertion. Reports lying in bed does not cause him to be shob. He reports his oxygen saturation was 98% with a HR in the "upper 40s." Denies chest pain, arm pain, jaw pain, diaphoresis, confusion, dizziness. He reports he has seen his oxygen saturation in the 80s at home.   ROS: see HPI     Objective:    BP (!) 140/66   Pulse (!) 41   Wt 122 lb 12.8 oz (55.7 kg)   SpO2 100%   BMI 19.23 kg/m   Physical Exam Vitals reviewed.  Constitutional:      Appearance: He is well-developed.  Cardiovascular:     Rate and Rhythm: Bradycardia present. Rhythm irregular.     Pulses: Normal pulses.     Heart sounds:     Gallop present. S3 sounds present.  Pulmonary:     Effort: Pulmonary effort is normal.     Breath sounds: Normal breath sounds.  Musculoskeletal:     Right lower leg: 1+ Pitting Edema present.     Left lower leg: 1+ Pitting Edema present.  Neurological:     Mental Status: He is alert.  Psychiatric:        Mood and Affect: Mood normal.        Behavior: Behavior normal.    Assessment & Plan:   1. Dyspnea on exertion (Primary) Patient is a pleasant  87 year old patient who presents today for ongoing dyspnea on exertion. He does report noticeable shortness of breath when he is at rest also. Patient is well appearing and is in no acute respiratory distress.  Cardiovascular exam with S1, S2, and S3 gallop present.  No murmurs present. Lungs clear to auscultation bilaterally without crackles, rhonchi, or wheezing.  EKG performed today with normal sinus rhythm in the 80s with frequent PVCs.  Comparison to EKG performed in April 2024 is similar.  No adventitious breath sounds heard, but due to ongoing issues with shortness of breath will also update CXR today. Unclear etiology of dyspnea on exertion- differentials include (but are not limited to) deconditioning, acute heart failure, emphysema/chronic bronchitis/COPD, valvular dysfunction. Recent EF done in March 2024 with results of 55-60%. Patient was not a previous heavy smoker, only smoking for about one year. Will obtain CBC to look for anemia, BMP to assess kidney function and potassium level, BNP to assess heart function, and thyroid function. Patient does have a cardiology appointment tomorrow- may be reasonable to obtain echo or perform 14-day cardiac monitor (?). Will update patient with results and will determine plan of care.  - CBC w/Diff/Platelet -  Basic Metabolic Panel (BMET) - B Nat Peptide - TSH Rfx on Abnormal to Free T4 - DG Chest 2 View; Future  2. Bilateral leg edema Patient reports taking furosemide 40mg  daily with noticeable lower extremity edema. Oxygen saturation monitored with ambulation- decreased down to 90% with Jolyne Loa, CMA with patient visually short of breath. Oxygen saturation monitored during portion of visit, oxygen saturation fluctuated from 96-100% with HR 45-49. Would be reasonable to change furosemide to torsemide, but will defer to cardiology since patient is seeing them tomorrow. Will obtain kidney function and potassium level today.  - CBC w/Diff/Platelet -  Basic Metabolic Panel (BMET) - B Nat Peptide - TSH Rfx on Abnormal to Free T4 - DG Chest 2 View; Future  Return in about 6 weeks (around 10/07/2023) for ongoing shortness of breath .  Spent 40 minutes on this patient encounter, including preparation, chart review, face-to-face counseling with patient and coordination of care, and documentation of encounter.    Alyson Reedy, FNP

## 2023-08-27 ENCOUNTER — Ambulatory Visit: Payer: Medicare Other | Attending: Cardiology | Admitting: Cardiology

## 2023-08-27 ENCOUNTER — Other Ambulatory Visit (HOSPITAL_BASED_OUTPATIENT_CLINIC_OR_DEPARTMENT_OTHER): Payer: Self-pay

## 2023-08-27 ENCOUNTER — Emergency Department (HOSPITAL_COMMUNITY): Payer: Medicare Other

## 2023-08-27 ENCOUNTER — Inpatient Hospital Stay (HOSPITAL_COMMUNITY)
Admission: EM | Admit: 2023-08-27 | Discharge: 2023-08-31 | DRG: 175 | Disposition: A | Discharge status: Home / Self Care | Payer: Medicare Other | Attending: Internal Medicine | Admitting: Internal Medicine

## 2023-08-27 ENCOUNTER — Other Ambulatory Visit: Payer: Self-pay

## 2023-08-27 ENCOUNTER — Encounter: Payer: Self-pay | Admitting: Cardiology

## 2023-08-27 ENCOUNTER — Encounter (HOSPITAL_COMMUNITY): Payer: Self-pay

## 2023-08-27 VITALS — BP 150/62 | HR 49 | Ht 65.0 in | Wt 123.6 lb

## 2023-08-27 DIAGNOSIS — I82412 Acute embolism and thrombosis of left femoral vein: Secondary | ICD-10-CM | POA: Diagnosis present

## 2023-08-27 DIAGNOSIS — N179 Acute kidney failure, unspecified: Secondary | ICD-10-CM | POA: Diagnosis not present

## 2023-08-27 DIAGNOSIS — M79606 Pain in leg, unspecified: Secondary | ICD-10-CM | POA: Diagnosis not present

## 2023-08-27 DIAGNOSIS — R0989 Other specified symptoms and signs involving the circulatory and respiratory systems: Secondary | ICD-10-CM

## 2023-08-27 DIAGNOSIS — I5021 Acute systolic (congestive) heart failure: Secondary | ICD-10-CM | POA: Diagnosis not present

## 2023-08-27 DIAGNOSIS — I4510 Unspecified right bundle-branch block: Secondary | ICD-10-CM

## 2023-08-27 DIAGNOSIS — R0609 Other forms of dyspnea: Secondary | ICD-10-CM | POA: Diagnosis not present

## 2023-08-27 DIAGNOSIS — I2699 Other pulmonary embolism without acute cor pulmonale: Principal | ICD-10-CM | POA: Diagnosis present

## 2023-08-27 DIAGNOSIS — I493 Ventricular premature depolarization: Secondary | ICD-10-CM

## 2023-08-27 DIAGNOSIS — I82442 Acute embolism and thrombosis of left tibial vein: Secondary | ICD-10-CM | POA: Diagnosis present

## 2023-08-27 DIAGNOSIS — Z86711 Personal history of pulmonary embolism: Secondary | ICD-10-CM

## 2023-08-27 DIAGNOSIS — R634 Abnormal weight loss: Secondary | ICD-10-CM | POA: Diagnosis present

## 2023-08-27 DIAGNOSIS — Z7951 Long term (current) use of inhaled steroids: Secondary | ICD-10-CM

## 2023-08-27 DIAGNOSIS — J449 Chronic obstructive pulmonary disease, unspecified: Secondary | ICD-10-CM | POA: Diagnosis not present

## 2023-08-27 DIAGNOSIS — Z8249 Family history of ischemic heart disease and other diseases of the circulatory system: Secondary | ICD-10-CM | POA: Diagnosis not present

## 2023-08-27 DIAGNOSIS — I82402 Acute embolism and thrombosis of unspecified deep veins of left lower extremity: Secondary | ICD-10-CM | POA: Diagnosis not present

## 2023-08-27 DIAGNOSIS — I2609 Other pulmonary embolism with acute cor pulmonale: Principal | ICD-10-CM

## 2023-08-27 DIAGNOSIS — N1831 Chronic kidney disease, stage 3a: Secondary | ICD-10-CM | POA: Diagnosis not present

## 2023-08-27 DIAGNOSIS — I824Y2 Acute embolism and thrombosis of unspecified deep veins of left proximal lower extremity: Secondary | ICD-10-CM | POA: Diagnosis not present

## 2023-08-27 DIAGNOSIS — I700 Atherosclerosis of aorta: Secondary | ICD-10-CM

## 2023-08-27 DIAGNOSIS — I82452 Acute embolism and thrombosis of left peroneal vein: Secondary | ICD-10-CM | POA: Diagnosis not present

## 2023-08-27 DIAGNOSIS — R609 Edema, unspecified: Secondary | ICD-10-CM

## 2023-08-27 DIAGNOSIS — Z79899 Other long term (current) drug therapy: Secondary | ICD-10-CM

## 2023-08-27 DIAGNOSIS — J439 Emphysema, unspecified: Secondary | ICD-10-CM | POA: Diagnosis not present

## 2023-08-27 DIAGNOSIS — R0602 Shortness of breath: Secondary | ICD-10-CM | POA: Diagnosis not present

## 2023-08-27 DIAGNOSIS — M7989 Other specified soft tissue disorders: Secondary | ICD-10-CM

## 2023-08-27 DIAGNOSIS — J4489 Other specified chronic obstructive pulmonary disease: Secondary | ICD-10-CM

## 2023-08-27 DIAGNOSIS — I491 Atrial premature depolarization: Secondary | ICD-10-CM | POA: Diagnosis not present

## 2023-08-27 DIAGNOSIS — I82432 Acute embolism and thrombosis of left popliteal vein: Secondary | ICD-10-CM | POA: Diagnosis not present

## 2023-08-27 HISTORY — DX: Other pulmonary embolism without acute cor pulmonale: I26.99

## 2023-08-27 LAB — CBC WITH DIFFERENTIAL/PLATELET
Abs Immature Granulocytes: 0.04 10*3/uL (ref 0.00–0.07)
Basophils Absolute: 0.1 10*3/uL (ref 0.0–0.1)
Basophils Relative: 1 %
Eosinophils Absolute: 0.3 10*3/uL (ref 0.0–0.5)
Eosinophils Relative: 4 %
HCT: 45.4 % (ref 39.0–52.0)
Hemoglobin: 14.8 g/dL (ref 13.0–17.0)
Immature Granulocytes: 1 %
Lymphocytes Relative: 16 %
Lymphs Abs: 1.4 10*3/uL (ref 0.7–4.0)
MCH: 29 pg (ref 26.0–34.0)
MCHC: 32.6 g/dL (ref 30.0–36.0)
MCV: 89 fL (ref 80.0–100.0)
Monocytes Absolute: 0.5 10*3/uL (ref 0.1–1.0)
Monocytes Relative: 6 %
Neutro Abs: 6.4 10*3/uL (ref 1.7–7.7)
Neutrophils Relative %: 72 %
Platelets: 191 10*3/uL (ref 150–400)
RBC: 5.1 MIL/uL (ref 4.22–5.81)
RDW: 14 % (ref 11.5–15.5)
WBC: 8.7 10*3/uL (ref 4.0–10.5)
nRBC: 0 % (ref 0.0–0.2)

## 2023-08-27 LAB — COMPREHENSIVE METABOLIC PANEL
ALT: 14 U/L (ref 0–44)
AST: 23 U/L (ref 15–41)
Albumin: 3.6 g/dL (ref 3.5–5.0)
Alkaline Phosphatase: 59 U/L (ref 38–126)
Anion gap: 14 (ref 5–15)
BUN: 39 mg/dL — ABNORMAL HIGH (ref 8–23)
CO2: 21 mmol/L — ABNORMAL LOW (ref 22–32)
Calcium: 9.3 mg/dL (ref 8.9–10.3)
Chloride: 107 mmol/L (ref 98–111)
Creatinine, Ser: 1.27 mg/dL — ABNORMAL HIGH (ref 0.61–1.24)
GFR, Estimated: 55 mL/min — ABNORMAL LOW (ref >60)
Glucose, Bld: 114 mg/dL — ABNORMAL HIGH (ref 70–99)
Potassium: 4 mmol/L (ref 3.5–5.1)
Sodium: 142 mmol/L (ref 135–145)
Total Bilirubin: 0.6 mg/dL (ref 0.0–1.2)
Total Protein: 6.5 g/dL (ref 6.5–8.1)

## 2023-08-27 LAB — BASIC METABOLIC PANEL
BUN/Creatinine Ratio: 20 (ref 10–24)
BUN: 28 mg/dL — ABNORMAL HIGH (ref 8–27)
CO2: 22 mmol/L (ref 20–29)
Calcium: 9.3 mg/dL (ref 8.6–10.2)
Chloride: 106 mmol/L (ref 96–106)
Creatinine, Ser: 1.43 mg/dL — ABNORMAL HIGH (ref 0.76–1.27)
Glucose: 89 mg/dL (ref 70–99)
Potassium: 5 mmol/L (ref 3.5–5.2)
Sodium: 144 mmol/L (ref 134–144)
eGFR: 48 mL/min/{1.73_m2} — ABNORMAL LOW (ref >59)

## 2023-08-27 LAB — PROTIME-INR
INR: 1.1 (ref 0.8–1.2)
Prothrombin Time: 14.4 s (ref 11.4–15.2)

## 2023-08-27 LAB — SPECIMEN STATUS REPORT

## 2023-08-27 LAB — TROPONIN I (HIGH SENSITIVITY)
Troponin I (High Sensitivity): 22 ng/L — ABNORMAL HIGH (ref <18)
Troponin I (High Sensitivity): 26 ng/L — ABNORMAL HIGH (ref <18)

## 2023-08-27 LAB — BRAIN NATRIURETIC PEPTIDE: BNP: 85.7 pg/mL (ref 0.0–100.0)

## 2023-08-27 MED ORDER — BISOPROLOL FUMARATE 5 MG PO TABS
5.0000 mg | ORAL_TABLET | Freq: Every day | ORAL | 3 refills | Status: DC
  Filled 2023-08-27: qty 90, 90d supply, fill #0

## 2023-08-27 MED ORDER — IOHEXOL 350 MG/ML SOLN
75.0000 mL | Freq: Once | INTRAVENOUS | Status: AC | PRN
  Administered 2023-08-27: 75 mL via INTRAVENOUS

## 2023-08-27 MED ORDER — TRELEGY ELLIPTA 100-62.5-25 MCG/ACT IN AEPB
1.0000 | INHALATION_SPRAY | Freq: Every day | RESPIRATORY_TRACT | 5 refills | Status: DC
  Filled 2023-08-27: qty 60, 30d supply, fill #0

## 2023-08-27 MED ORDER — HEPARIN BOLUS VIA INFUSION
3800.0000 [IU] | Freq: Once | INTRAVENOUS | Status: AC
  Administered 2023-08-27: 3800 [IU] via INTRAVENOUS
  Filled 2023-08-27: qty 3800

## 2023-08-27 MED ORDER — HEPARIN (PORCINE) 25000 UT/250ML-% IV SOLN
900.0000 [IU]/h | INTRAVENOUS | Status: DC
  Administered 2023-08-27: 900 [IU]/h via INTRAVENOUS
  Administered 2023-08-28 – 2023-08-29 (×2): 1000 [IU]/h via INTRAVENOUS
  Administered 2023-08-30: 950 [IU]/h via INTRAVENOUS
  Filled 2023-08-27 (×4): qty 250

## 2023-08-27 NOTE — Progress Notes (Signed)
 Cardiology Office Note:  .   Date:  08/27/2023  ID:  David [name], DOB 1935-09-07, MRN [id number] PCP:  David Small, FNP  [facility] Providers Cardiologist:  Shelda Bruckner, MD  Electrophysiologist:  None  Click to update primary MD,subspecialty MD or APP then REFRESH:1}    Chief Complaint  Patient presents with   Follow-up    Shortness of breath, lower extremity swelling    History of Present Illness: .   David [name] is a 87 y.o. Caucasian male whose past medical history and cardiovascular risk factors includes: Aortic atherosclerosis, right bundle branch block, proximal ascending aorta dilatation per echocardiogram, advanced age.  Patient is accompanied by his daughter Charlies at today's office visit and she also provides collateral history during this encounter.    Since April 2024 patient has been experiencing shortness of breath as well as lower extremity swelling however in the recent past the symptoms have become more progressive.  They also wax and wane according to the daughter.  He was in his usual state of health until August 21, 2023.  Since then he is becoming more dyspneic and short of breath.  They went in to see PCP for an acute visit yesterday and had labs, chest x-ray performed and referred to cardiology for further evaluation.  Shortness of breath occurs with conversation, at rest, and with exertion.  He feels better when he lays flat, denies PND.  He has asymmetrical swelling for which she was taking higher doses of Lasix  and now back to his home dose of 40 mg p.o. daily.  Chest x-ray performed yesterday does not illustrate concerns for congestion but findings to suggest COPD.  BMP is also within normal limits.  He has an appointment to see pulmonary medicine in January 2025.  No prolonged immobilization or history of DVT or PE.  Review of Systems: .   Review of Systems  Cardiovascular:  Positive for dyspnea on exertion and leg swelling.  Negative for chest pain, claudication, irregular heartbeat, near-syncope, orthopnea, palpitations, paroxysmal nocturnal dyspnea and syncope.  Respiratory:  Positive for shortness of breath.   Hematologic/Lymphatic: Negative for bleeding problem.    Studies Reviewed:   EKG: EKG Interpretation Date/Time:  Tuesday August 27 2023 14:50:24 EST Ventricular Rate:  94 PR Interval:  158 QRS Duration:  132 QT Interval:  408 QTC Calculation: 510 R Axis:   139  Text Interpretation: Sinus rhythm with frequent Premature ventricular complexes and Premature atrial complexes Right atrial enlargement Right bundle branch block , plus right ventricular hypertrophy When compared with ECG of 26-Aug-2023 15:13, Premature atrial complexes are now Present Confirmed by Michele Richardson ([id number]) on 08/27/2023 3:00:56 PM  Echocardiogram: March 2024  1. Left ventricular ejection fraction, by estimation, is 55 to 60%. The  left ventricle has normal function. The left ventricle has no regional  wall motion abnormalities. Left ventricular diastolic parameters are indeterminate.   2. Right ventricular systolic function is normal. The right ventricular size is normal. There is moderately elevated pulmonary artery systolic  pressure.   3. Left atrial size was moderately dilated.   4. The mitral valve is grossly normal. Mild to moderate mitral valve  regurgitation.   5. The aortic valve is tricuspid. There is mild calcification of the aortic valve. There is mild thickening of the aortic valve. Aortic valve  regurgitation is mild to moderate. Aortic valve sclerosis/calcification is present, without any evidence of aortic stenosis.   6. Aortic dilatation noted. There is borderline dilatation  of the aortic root, measuring 40 mm. There is mild dilatation of the ascending aorta, measuring 41 mm.   7. The inferior vena cava is dilated in size with >50% respiratory variability, suggesting right atrial pressure of 8 mmHg.   8.  Frequent PVCs throughout the study.   RADIOLOGY: Chest xray:  08/26/2023 1. No acute cardiopulmonary disease. 2. Chronic hyperinflation and coarsened interstitial markings suggestive of COPD.   Risk Assessment/Calculations:   NA  Labs:       Latest Ref Rng & Units 12/17/2022    7:44 PM 09/24/2022   12:58 PM 09/18/2022    3:19 PM  CBC  WBC 4.0 - 10.5 K/uL 8.1  7.5  8.4   Hemoglobin 13.0 - 17.0 g/dL 85.2  85.1  85.1   Hematocrit 39.0 - 52.0 % 45.0  43.6  46.3   Platelets 150 - 400 K/uL 193  172  216        Latest Ref Rng & Units 08/26/2023   12:00 AM 12/17/2022    7:44 PM 09/18/2022    3:19 PM  BMP  Glucose 70 - 99 mg/dL 89  96  91   BUN 8 - 27 mg/dL 28  29  25    Creatinine 0.76 - 1.27 mg/dL 8.56  8.87  9.04   BUN/Creat Ratio 10 - 24 20     Sodium 134 - 144 mmol/L 144  141  138   Potassium 3.5 - 5.2 mmol/L 5.0  4.5  4.3   Chloride 96 - 106 mmol/L 106  105  101   CO2 20 - 29 mmol/L 22  26  28    Calcium 8.6 - 10.2 mg/dL 9.3  9.4  9.7       Latest Ref Rng & Units 08/26/2023   12:00 AM 12/17/2022    7:44 PM 09/18/2022    3:19 PM  CMP  Glucose 70 - 99 mg/dL 89  96  91   BUN 8 - 27 mg/dL 28  29  25    Creatinine 0.76 - 1.27 mg/dL 8.56  8.87  9.04   Sodium 134 - 144 mmol/L 144  141  138   Potassium 3.5 - 5.2 mmol/L 5.0  4.5  4.3   Chloride 96 - 106 mmol/L 106  105  101   CO2 20 - 29 mmol/L 22  26  28    Calcium 8.6 - 10.2 mg/dL 9.3  9.4  9.7   Total Protein 6.5 - 8.1 g/dL   7.0   Total Bilirubin 0.3 - 1.2 mg/dL   0.7   Alkaline Phos 38 - 126 U/L   48   AST 15 - 41 U/L   26   ALT 0 - 44 U/L   20     No results found for: CHOL, HDL, LDLCALC, LDLDIRECT, TRIG, CHOLHDL No results for input(s): LIPOA in the last 8760 hours. No components found for: NTPROBNP No results for input(s): PROBNP in the last 8760 hours. No results for input(s): TSH in the last 8760 hours.   Physical Exam:    Today's Vitals   08/27/23 1443  BP: (!) 150/62  Pulse: (!) 49   SpO2: 98%  Weight: 123 lb 9.6 oz (56.1 kg)  Height: 5' 5 (1.651 m)   Body mass index is 20.57 kg/m. Wt Readings from Last 3 Encounters:  08/27/23 123 lb 9.6 oz (56.1 kg)  08/26/23 122 lb 12.8 oz (55.7 kg)  08/02/23 129 lb 12.8 oz (58.9 kg)  Physical Exam  Constitutional: No distress.  hemodynamically stable  Neck: No JVD present.  Cardiovascular: Regular rhythm, S1 normal and S2 normal. Tachycardia present. Exam reveals no gallop, no S3 and no S4.  No murmur heard. Pulmonary/Chest: Effort normal. No stridor. He has no wheezes. He has no rales.  Decreased breath sounds bilaterally, poor inspiratory effort  Abdominal: Soft. Bowel sounds are normal. He exhibits no distension. There is no abdominal tenderness.  Musculoskeletal:        General: Tenderness (LLE) and edema (LLE >RLE) present.     Cervical back: Neck supple.  Neurological: He is alert and oriented to person, place, and time. He has intact cranial nerves (2-12).  Skin: Skin is warm.     Impression & Recommendation(s):  Impression:   ICD-10-CM   1. Dyspnea on exertion  R06.09 EKG 12-Lead    ECHOCARDIOGRAM COMPLETE    2. Localized swelling of lower extremity  M79.89     3. PVC (premature ventricular contraction)  I49.3 bisoprolol  (ZEBETA ) 5 MG tablet    LONG TERM MONITOR (3-14 DAYS)    4. Premature atrial contractions  I49.1     5. Right bundle branch block (RBBB) on electrocardiogram (ECG)  I45.10     6. Atherosclerosis of aorta (HCC)  I70.0        Recommendation(s):  Dyspnea on exertion Localized swelling of lower extremity Presents today for an acute visit. Follows longitudinally with Dr. Dawna Bruckner Clinically euvolemic, BNP within normal limits, no JVP, chest x-ray from 08/16/2023 does not illustrate congestion. Chest x-ray findings concerning for COPD.  Patient does not have a prior diagnosis of COPD.  Prescribed a new inhaler which she has not started yet.  Sees pulmonary medicine in  January 2024. EKG illustrates sinus rhythm with PVCs and PACs which may be contributing to his shortness of breath but not a complete picture. Given the acute presentation, shortness of breath with rest and with conversation, asymmetrical lower extremity swelling with tenderness in his left calf would like to rule out DVT and PE.  Given the fact that it is a holiday weekend and the acute nature of his symptoms recommended that he goes to the ED for further evaluation and management.  His daughter Charlies will take him there after today's office visit. Echo will be ordered to evaluate for structural heart disease and left ventricular systolic function. No prior ischemic workup-will discuss at the next office visit. Recommended that he follows up with his primary cardiologist/APP in 2 weeks for further evaluation and management  PVC (premature ventricular contraction) Premature atrial contractions 3-day Zio patch to evaluate for dysrhythmias and PAC/PVC burden. Start bisoprolol  5 mg p.o. daily  Right bundle branch block (RBBB) on electrocardiogram (ECG) Chronic and stable  Orders Placed:  Orders Placed This Encounter  Procedures   LONG TERM MONITOR (3-14 DAYS)    Standing Status:   Future    Number of Occurrences:   1    Expected Date:   08/30/2023    Expiration Date:   08/26/2024    Where should this test be performed?:   [facility]    Does the patient have an implanted cardiac device?:   No    Prescribed days of wear:   3    Type of enrollment:   Clinic Enrollment   EKG 12-Lead   ECHOCARDIOGRAM COMPLETE    Standing Status:   Future    Expected Date:   09/03/2023    Expiration Date:   08/26/2024  Where should this test be performed:   [facility])    Does the patient weigh less than or greater than 250 lbs?:   Patient weighs less than 250 lbs    Perflutren DEFINITY (image enhancing agent) should be administered unless hypersensitivity or allergy exist:    Administer Perflutren    Reason for exam-Echo:   Other-Full Diagnosis List    Full ICD-10/Reason for Exam:   SOB (shortness of breath) [758119]   As part of today's acute visit I reviewed notes from PCP dated 08/26/2023, labs from 08/26/2023, chest x-ray 08/26/2023, last note from primary cardiologist Dr. Lonni from December 05, 2022.  Final Medication List:    Meds ordered this encounter  Medications   bisoprolol  (ZEBETA ) 5 MG tablet    Sig: Take 1 tablet (5 mg total) by mouth daily.    Dispense:  90 tablet    Refill:  3    There are no discontinued medications.   Current Outpatient Medications:    bisoprolol  (ZEBETA ) 5 MG tablet, Take 1 tablet (5 mg total) by mouth daily., Disp: 90 tablet, Rfl: 3   dextromethorphan (DELSYM) 30 MG/5ML liquid, Take by mouth as needed for cough., Disp: , Rfl:    furosemide  (LASIX ) 40 MG tablet, Take 1 tablet (40 mg total) by mouth daily. Take daily for 3 days then use as needed for swelling, weight gain of 2 pounds overnight or 5 pounds in one week., Disp: 90 tablet, Rfl: 3   furosemide  (LASIX ) 40 MG tablet, Take 1 tablet (40 mg total) by mouth daily for 3 days, then take 1 daily if needed  for swelling or fluid/weight gain of 2 pounds overnight or 5 pounds in a week., Disp: 90 tablet, Rfl: 2   ipratropium-albuterol  (DUONEB) 0.5-2.5 (3) MG/3ML SOLN, Inhale 3 mLs by nebulization every 4 (four) hours as needed., Disp: 90 mL, Rfl: 1   omeprazole  (PRILOSEC) 20 MG capsule, Take 1 capsule (20 mg total) by mouth 2 (two) times daily before a meal., Disp: 180 capsule, Rfl: 1   Fluticasone -Umeclidin-Vilant (TRELEGY ELLIPTA ) 100-62.5-25 MCG/ACT AEPB, Inhale 1 puff into the lungs daily., Disp: 60 each, Rfl: 5  Consent:   N/A  Disposition:   2-week follow-up with primary cardiologist/APP. Patient may be asked to follow-up sooner based on the results of the above-mentioned testing.  His questions and concerns were addressed to his satisfaction. He voices  understanding of the recommendations provided during this encounter.    Signed, Madonna Large, DO, [facility]  [facility]  [address] [place], [place] [address] 08/27/2023 5:12 PM

## 2023-08-27 NOTE — Progress Notes (Signed)
LLE venous duplex has been completed.  Preliminary findings given to [employer], PA-C.   Results can be found under chart review under CV PROC. 08/27/2023 7:47 PM Jenea Dake RVT, RDMS

## 2023-08-27 NOTE — ED Triage Notes (Signed)
Pt sent from cardiology office to rule out DVT and PE. Leg pain and swelling L>R.

## 2023-08-27 NOTE — Patient Instructions (Signed)
 Medication Instructions:  Your physician has recommended you make the following change in your medication:   START Bisoprolol  5 mg once daily   *If you need a refill on your cardiac medications before your next appointment, please call your pharmacy*  Lab Work: None ordered today. If you have labs (blood work) drawn today and your tests are completely normal, you will receive your results only by: MyChart Message (if you have MyChart) OR A paper copy in the mail If you have any lab test that is abnormal or we need to change your treatment, we will call you to review the results.  Testing/Procedures: Your physician has requested that you have an echocardiogram. Echocardiography is a painless test that uses sound waves to create images of your heart. It provides your doctor with information about the size and shape of your heart and how well your heart's chambers and valves are working. This procedure takes approximately one hour. There are no restrictions for this procedure. Please do NOT wear cologne, perfume, aftershave, or lotions (deodorant is allowed). Please arrive 15 minutes prior to your appointment time.  Please note: We ask at that you not bring children with you during ultrasound (echo/ vascular) testing. Due to room size and safety concerns, children are not allowed in the ultrasound rooms during exams. Our front office staff cannot provide observation of children in our lobby area while testing is being conducted. An adult accompanying a patient to their appointment will only be allowed in the ultrasound room at the discretion of the ultrasound technician under special circumstances. We apologize for any inconvenience.  Your physician has requested that you wear a Zio heart monitor for 3 days. This will be mailed to your home with instructions on how to apply the monitor and how to return it when finished. Please allow 2 weeks after returning the heart monitor before our office  calls you with the results.   Follow-Up: At [facility], you and your health needs are our priority.  As part of our continuing mission to provide you with exceptional heart care, we have created designated Provider Care Teams.  These Care Teams include your primary Cardiologist (physician) and Advanced Practice Providers (APPs -  Physician Assistants and Nurse Practitioners) who all work together to provide you with the care you need, when you need it.  We recommend signing up for the patient portal called MyChart.  Sign up information is provided on this After Visit Summary.  MyChart is used to connect with patients for Virtual Visits (Telemedicine).  Patients are able to view lab/test results, encounter notes, upcoming appointments, etc.  Non-urgent messages can be sent to your provider as well.   To learn more about what you can do with MyChart, go to [email or web address].    Your next appointment:   2 week(s)  The format for your next appointment:   In Person  Provider:   Shelda Bruckner, MD or her PA {  Other Instructions ZIO XT- Long Term Monitor Instructions     Your physician has requested you wear a ZIO patch monitor for 3 days.  This is a single patch monitor. [employer] supplies one patch monitor per enrollment. Additional  stickers are not available. Please do not apply patch if you will be having a Nuclear Stress Test,  Echocardiogram, Cardiac CT, MRI, or Chest Xray during the period you would be wearing the  monitor. The patch cannot be worn during these tests. You cannot remove and re-apply the  ZIO  XT patch monitor.  Your ZIO patch monitor will be mailed 3 day USPS to your address on file. It may take 3-5 days  to receive your monitor after you have been enrolled.  Once you have received your monitor, please review the enclosed instructions. Your monitor  has already been registered assigning a specific monitor serial # to you.     Billing and Patient  Assistance Program Information     We have supplied [employer] with any of your insurance information on file for billing purposes.  [employer] offers a sliding scale Patient Assistance Program for patients that do not have  insurance, or whose insurance does not completely cover the cost of the ZIO monitor.  You must apply for the Patient Assistance Program to qualify for this discounted rate.  To apply, please call [employer] at [phone], select option 4, select option 2, ask to apply for  Patient Assistance Program. Meredeth will ask your household income, and how many people  are in your household. They will quote your out-of-pocket cost based on that information.  [employer] will also be able to set up a 63-month, interest-free payment plan if needed.     Applying the monitor     Shave hair from upper left chest.  Hold abrader disc by orange tab. Rub abrader in 40 strokes over the upper left chest as  indicated in your monitor instructions.  Clean area with 4 enclosed alcohol pads. Let dry.  Apply patch as indicated in monitor instructions. Patch will be placed under collarbone on left  side of chest with arrow pointing upward.  Rub patch adhesive wings for 2 minutes. Remove white label marked 1. Remove the white  label marked 2. Rub patch adhesive wings for 2 additional minutes.  While looking in a mirror, press and release button in center of patch. A small green light will  flash 3-4 times. This will be your only indicator that the monitor has been turned on.  Do not shower for the first 24 hours. You may shower after the first 24 hours.  Press the button if you feel a symptom. You will hear a small click. Record Date, Time and  Symptom in the Patient Logbook.  When you are ready to remove the patch, follow instructions on the last 2 pages of Patient  Logbook. Stick patch monitor onto the last page of Patient Logbook.  Place Patient Logbook in the blue and white box. Use locking tab on  box and tape box closed  securely. The blue and white box has prepaid postage on it. Please place it in the mailbox as  soon as possible. Your physician should have your test results approximately 7 days after the  monitor has been mailed back to [facility].  Call [facility] Customer Care at [phone] if you have questions regarding  your ZIO XT patch monitor. Call them immediately if you see an orange light blinking on your  monitor.  If your monitor falls off in less than 4 days, contact our Monitor department at [phone].  If your monitor becomes loose or falls off after 4 days call [employer] at [phone] for  suggestions on securing your monitor.

## 2023-08-27 NOTE — Progress Notes (Signed)
 PHARMACY - ANTICOAGULATION CONSULT NOTE  Pharmacy Consult for heparin  gtt Indication: pulmonary embolus  No Known Allergies  Patient Measurements: Height: 5' 5 (165.1 cm) Weight: 55.8 kg (123 lb) IBW/kg (Calculated) : 61.5 Heparin  Dosing Weight: 55.8 kg  Vital Signs: Temp: 97.6 F (36.4 C) (12/31 1838) BP: 156/55 (12/31 1838) Pulse Rate: 69 (12/31 2042)  Labs: Recent Labs    08/26/23 0000 08/27/23 1853 08/27/23 1929  HGB  --  14.8  --   HCT  --  45.4  --   PLT  --  191  --   LABPROT  --   --  14.4  INR  --   --  1.1  CREATININE 1.43* 1.27*  --   TROPONINIHS  --  22*  --     Estimated Creatinine Clearance: 33.6 mL/min (A) (by C-G formula based on SCr of 1.27 mg/dL (H)).   Medical History: Past Medical History:  Diagnosis Date   Bell's palsy    Kidney stones    UTI (urinary tract infection)    Assessment: 87 yo M with PE +RHS. Pharmacy consulted for heparin . No anticoagulants PTA.  CBC WNL  hsTrop 22  Goal of Therapy:  Heparin  level 0.3-0.7 units/ml Monitor platelets by anticoagulation protocol: Yes   Plan:  Heparin  3800 units IV x1 followed by  Heparin  900 units/hr 8hr HL  Daily HL, CBC F/u s/s bleeding, LOT and longterm anticoag plans  Sharyne Glatter, PharmD, BCCCP Clinical Pharmacist 08/27/2023 8:45 PM

## 2023-08-27 NOTE — ED Provider Triage Note (Signed)
 Emergency Medicine Provider Triage Evaluation Note  David [name] , a 87 y.o. male  was evaluated in triage.  Pt complains of sob and LLE edema for month per family member. Sent by cardiology for r/o of PE and DVT.  Review of Systems  Positive: SOB Negative: Chest pain  Physical Exam  BP (!) 156/55   Pulse (!) 47   Temp 97.6 F (36.4 C)   Resp 18   SpO2 95%  Gen:   Awake, no distress   Resp:  Normal effort  MSK:   Moves extremities without difficulty  Other:  1+pitting of LLE, no edema of RLE  Medical Decision Making  Medically screening exam initiated at 6:43 PM.  Appropriate orders placed.  David [name] was informed that the remainder of the evaluation will be completed by another provider, this initial triage assessment does not replace that evaluation, and the importance of remaining in the ED until their evaluation is complete.     Philippa Lyle [place], [place] 08/27/23 8153

## 2023-08-27 NOTE — ED Provider Notes (Signed)
 [facility] Provider Note   CSN: [id number] Arrival date & time: 08/27/23  1809     History  Chief Complaint  Patient presents with   Leg Pain    LATAVIUS CAPIZZI is a 87 y.o. male.  Pt is an 87 yo male with pmhx significant for copd and kidney stones.  Pt has had left leg pain and sob for several months.  Pt did see cardiology today who sent him here to eval for dvt and pe.  Pt does not feel sob at rest.  He was seeing the cardiologist due to doe.       Home Medications Prior to Admission medications   Medication Sig Start Date End Date Taking? Authorizing Provider  bisoprolol  (ZEBETA ) 5 MG tablet Take 1 tablet (5 mg total) by mouth daily. 08/27/23  Yes Tolia, Sunit, DO  dextromethorphan (DELSYM) 30 MG/5ML liquid Take 15 mg by mouth as needed for cough.   Yes [provider]  Fluticasone -Umeclidin-Vilant (TRELEGY ELLIPTA ) 100-62.5-25 MCG/ACT AEPB Inhale 1 puff into the lungs daily. 08/27/23  Yes Butler, Kristina, FNP  furosemide  (LASIX ) 40 MG tablet Take 1 tablet (40 mg total) by mouth daily for 3 days, then take 1 daily if needed  for swelling or fluid/weight gain of 2 pounds overnight or 5 pounds in a week. Patient taking differently: Take 40 mg by mouth daily. Take 1 tablet every day; if swelling increases in legs, take 2 tablets for 3 days and then resume normal dose 11/02/22  Yes Lonni Slain, MD  ibuprofen (ADVIL) 200 MG tablet Take 200 mg by mouth as needed for mild pain (pain score 1-3).   Yes [provider]  ipratropium-albuterol  (DUONEB) 0.5-2.5 (3) MG/3ML SOLN Inhale 3 mLs by nebulization every 4 (four) hours as needed. 08/26/23  Yes Towana Small, FNP  omeprazole  (PRILOSEC) 20 MG capsule Take 1 capsule (20 mg total) by mouth 2 (two) times daily before a meal. Patient taking differently: Take 20 mg by mouth as needed (heartburn and GERD). 09/24/22  Yes Early, Sara E, NP      Allergies     Patient has no known allergies.    Review of Systems   Review of Systems  Respiratory:  Positive for shortness of breath.   Musculoskeletal:        Left leg pain  All other systems reviewed and are negative.   Physical Exam Updated Vital Signs BP (!) 156/87 (BP Location: Left Arm)   Pulse 80   Temp 99 F (37.2 C) (Oral)   Resp 12   Ht 5' 5 (1.651 m)   Wt 55.8 kg   SpO2 96%   BMI 20.47 kg/m  Physical Exam Vitals and nursing note reviewed.  Constitutional:      Appearance: Normal appearance.  HENT:     Head: Normocephalic and atraumatic.     Right Ear: External ear normal.     Left Ear: External ear normal.     Nose: Nose normal.     Mouth/Throat:     Mouth: Mucous membranes are moist.     Pharynx: Oropharynx is clear.  Eyes:     Extraocular Movements: Extraocular movements intact.     Conjunctiva/sclera: Conjunctivae normal.     Pupils: Pupils are equal, round, and reactive to light.  Cardiovascular:     Rate and Rhythm: Normal rate and regular rhythm.     Pulses: Normal pulses.     Heart sounds: Normal heart  sounds.  Pulmonary:     Effort: Pulmonary effort is normal.     Breath sounds: Normal breath sounds.  Abdominal:     General: Abdomen is flat. Bowel sounds are normal.     Palpations: Abdomen is soft.  Musculoskeletal:        General: Normal range of motion.     Cervical back: Normal range of motion and neck supple.  Skin:    General: Skin is warm.     Capillary Refill: Capillary refill takes less than 2 seconds.  Neurological:     General: No focal deficit present.     Mental Status: He is alert and oriented to person, place, and time.  Psychiatric:        Mood and Affect: Mood normal.        Behavior: Behavior normal.     ED Results / Procedures / Treatments   Labs (all labs ordered are listed, but only abnormal results are displayed) Labs Reviewed  COMPREHENSIVE METABOLIC PANEL - Abnormal; Notable for the following components:      Result  Value   CO2 21 (*)    Glucose, Bld 114 (*)    BUN 39 (*)    Creatinine, Ser 1.27 (*)    GFR, Estimated 55 (*)    All other components within normal limits  BASIC METABOLIC PANEL - Abnormal; Notable for the following components:   CO2 21 (*)    BUN 36 (*)    Calcium 8.8 (*)    All other components within normal limits  TROPONIN I (HIGH SENSITIVITY) - Abnormal; Notable for the following components:   Troponin I (High Sensitivity) 22 (*)    All other components within normal limits  TROPONIN I (HIGH SENSITIVITY) - Abnormal; Notable for the following components:   Troponin I (High Sensitivity) 26 (*)    All other components within normal limits  CBC WITH DIFFERENTIAL/PLATELET  PROTIME-INR  HEPARIN  LEVEL (UNFRACTIONATED)  CBC  HEPARIN  LEVEL (UNFRACTIONATED)  HEPARIN  LEVEL (UNFRACTIONATED)  HEPARIN  LEVEL (UNFRACTIONATED)  BASIC METABOLIC PANEL  CBC    EKG EKG Interpretation Date/Time:  Tuesday August 27 2023 20:35:36 EST Ventricular Rate:  88 PR Interval:  168 QRS Duration:  142 QT Interval:  396 QTC Calculation: 480 R Axis:   83  Text Interpretation: Sinus rhythm Multiform ventricular premature complexes Right bundle branch block Confirmed by Lorette Mayo [phone]) on 08/28/2023 6:14:54 PM  Radiology VAS US  LOWER EXTREMITY VENOUS (DVT) (ONLY [facility] & [facility]) Result Date: 08/28/2023  Lower Venous DVT Study Patient Name:  LINELL SHAWN  Date of Exam:   08/27/2023 Medical Rec #: [id number]        Accession #:    [id number] Date of Birth: [birth year]        Patient Gender: M Patient Age:   45 years Exam Location:  [facility] Procedure:      VAS US  LOWER EXTREMITY VENOUS (DVT) Referring Phys: LYLE SMALL --------------------------------------------------------------------------------  Indications: Swelling, and SOB.  Comparison Study: No previous exams Performing Technologist: Jody Hill RVT, RDMS  Examination Guidelines: A complete evaluation includes B-mode imaging, spectral Doppler,  color Doppler, and power Doppler as needed of all accessible portions of each vessel. Bilateral testing is considered an integral part of a complete examination. Limited examinations for reoccurring indications may be performed as noted. The reflux portion of the exam is performed with the patient in reverse Trendelenburg.  +-----+---------------+---------+-----------+----------+--------------+ RIGHTCompressibilityPhasicitySpontaneityPropertiesThrombus Aging +-----+---------------+---------+-----------+----------+--------------+ CFV  Full  Yes      Yes                                 +-----+---------------+---------+-----------+----------+--------------+   +---------+---------------+---------+-----------+----------+--------------+ LEFT     CompressibilityPhasicitySpontaneityPropertiesThrombus Aging +---------+---------------+---------+-----------+----------+--------------+ CFV      Full           Yes      Yes                                 +---------+---------------+---------+-----------+----------+--------------+ SFJ      Partial                                      Acute          +---------+---------------+---------+-----------+----------+--------------+ FV Prox  Full           Yes      Yes                                 +---------+---------------+---------+-----------+----------+--------------+ FV Mid   Full           Yes      Yes                                 +---------+---------------+---------+-----------+----------+--------------+ FV DistalNone           No       No                   Acute          +---------+---------------+---------+-----------+----------+--------------+ PFV      Full                                                        +---------+---------------+---------+-----------+----------+--------------+ POP      None           No       No                   Acute           +---------+---------------+---------+-----------+----------+--------------+ PTV      None           No       No                   Acute          +---------+---------------+---------+-----------+----------+--------------+ PERO     None           No       No                   Acute          +---------+---------------+---------+-----------+----------+--------------+ Gastroc  None           No       No                                  +---------+---------------+---------+-----------+----------+--------------+  GSV      Partial        Yes      Yes                  Acute          +---------+---------------+---------+-----------+----------+--------------+ SSV      None           No       No                   Acute          +---------+---------------+---------+-----------+----------+--------------+    Summary: RIGHT: - No evidence of common femoral vein obstruction.   LEFT: - Findings consistent with acute deep vein thrombosis involving the SF junction, left femoral vein, left popliteal vein, left posterior tibial veins, and left peroneal veins. - Findings consistent with acute superficial vein thrombosis involving the left great saphenous vein, and left small saphenous vein. Findings consistent with acute intramuscular thrombosis involving the left gastrocnemius veins. - No cystic structure found in the popliteal fossa.  *See table(s) above for measurements and observations. Electronically signed by Penne Colorado MD on 08/28/2023 at 10:31:02 AM.    Final    CT Angio Chest PE W and/or Wo Contrast Result Date: 08/27/2023 CLINICAL DATA:  Leg pain and shortness of breath, initial encounter EXAM: CT ANGIOGRAPHY CHEST WITH CONTRAST TECHNIQUE: Multidetector CT imaging of the chest was performed using the standard protocol during bolus administration of intravenous contrast. Multiplanar CT image reconstructions and MIPs were obtained to evaluate the vascular anatomy. RADIATION DOSE REDUCTION:  This exam was performed according to the departmental dose-optimization program which includes automated exposure control, adjustment of the mA and/or kV according to patient size and/or use of iterative reconstruction technique. CONTRAST:  75mL OMNIPAQUE  IOHEXOL  350 MG/ML SOLN COMPARISON:  Chest x-ray from the previous day. FINDINGS: Cardiovascular: Thoracic aorta demonstrates atherosclerotic calcifications without aneurysmal dilatation. The pulmonary artery demonstrates extensive bilateral pulmonary emboli with evidence of right heart strain within RV/LV ratio of 1.0. Coronary calcifications are noted. Cardiac enlargement is noted. Mediastinum/Nodes: Thoracic inlet is within normal limits. No hilar or mediastinal adenopathy is noted. The esophagus as visualized is within normal limits. Lungs/Pleura: Lungs are well aerated bilaterally. Mild emphysematous changes are noted. No focal infiltrate or sizable effusion is seen. Upper Abdomen: Visualized upper abdomen is within normal limits. Musculoskeletal: Degenerative changes of the thoracic spine are noted. Chronic compression deformity at T5 is noted. Review of the MIP images confirms the above findings. IMPRESSION: Extensive bilateral pulmonary embolism with evidence of right heart strain as described above. No other focal acute abnormality is noted. Aortic Atherosclerosis (ICD10-I70.0) and Emphysema (ICD10-J43.9). Critical Value/emergent results were called by telephone at the time of interpretation on 08/27/2023 at 8:20 pm to Dr. Dean , who verbally acknowledged these results. Electronically Signed   By: Oneil Devonshire M.D.   On: 08/27/2023 20:30    Procedures Procedures    Medications Ordered in ED Medications  heparin  ADULT infusion 100 units/mL (25000 units/250mL) (1,000 Units/hr Intravenous New Bag/Given 08/28/23 1946)  fluticasone  furoate-vilanterol (BREO ELLIPTA ) 100-25 MCG/ACT 1 puff (1 puff Inhalation Given 08/28/23 0744)    And  umeclidinium  bromide (INCRUSE ELLIPTA ) 62.5 MCG/ACT 1 puff (1 puff Inhalation Given 08/28/23 0743)  ipratropium-albuterol  (DUONEB) 0.5-2.5 (3) MG/3ML nebulizer solution 3 mL (has no administration in time range)  sodium chloride  flush (NS) 0.9 % injection 3 mL (3 mLs Intravenous Not Given 08/28/23 1020)  acetaminophen  (TYLENOL ) tablet  650 mg (has no administration in time range)    Or  acetaminophen  (TYLENOL ) suppository 650 mg (has no administration in time range)  oxyCODONE  (Oxy IR/ROXICODONE ) immediate release tablet 2.5 mg (has no administration in time range)  senna-docusate (Senokot-S) tablet 1 tablet (has no administration in time range)  pantoprazole  (PROTONIX ) EC tablet 40 mg (40 mg Oral Given 08/28/23 1234)  polyethylene glycol (MIRALAX  / GLYCOLAX ) packet 17 g (17 g Oral Given 08/28/23 1234)  iohexol  (OMNIPAQUE ) 350 MG/ML injection 75 mL (75 mLs Intravenous Contrast Given 08/27/23 2012)  heparin  bolus via infusion 3,800 Units (3,800 Units Intravenous Bolus from Bag 08/27/23 2110)    ED Course/ Medical Decision Making/ A&P                                 Medical Decision Making Amount and/or Complexity of Data Reviewed Labs: ordered.  Risk Prescription drug management. Decision regarding hospitalization.   This patient presents to the ED for concern of sob, this involves an extensive number of treatment options, and is a complaint that carries with it a high risk of complications and morbidity.  The differential diagnosis includes PE, PNA, COPD   Co morbidities that complicate the patient evaluation  Copd and kidney stones   Additional history obtained:  Additional history obtained from epic chart review External records from outside source obtained and reviewed including daughter   Lab Tests:  I Ordered, and personally interpreted labs.  The pertinent results include:  cbc nl, cmp nl, trop 22, inr 1.1   Imaging Studies ordered:  I ordered imaging studies including ct chest, us   legs  I independently visualized and interpreted imaging which showed  US : RIGHT:  - No evidence of common femoral vein obstruction.              LEFT:  - Findings consistent with acute deep vein thrombosis involving the SF junction, left femoral vein, left popliteal vein, left posterior tibial veins, and left peroneal veins.  - Findings consistent with acute superficial vein thrombosis involving the left great saphenous vein, and left small saphenous vein.  Findings consistent with acute intramuscular thrombosis involving the left gastrocnemius veins.        - No cystic structure found in the popliteal fossa.     *See table(s) above for measurements and observations.  CT chest: Extensive bilateral pulmonary embolism with evidence of right heart  strain as described above.    No other focal acute abnormality is noted.    Aortic Atherosclerosis (ICD10-I70.0) and Emphysema (ICD10-J43.9).    Critical Value/emergent results were called by telephone at the time  of interpretation on 08/27/2023 at 8:20 pm to Dr. Dean , who  verbally acknowledged these results.   I agree with the radiologist interpretation   Cardiac Monitoring:  The patient was maintained on a cardiac monitor.  I personally viewed and interpreted the cardiac monitored which showed an underlying rhythm of: nsr   Medicines ordered and prescription drug management:  I ordered medication including heparin   for sx  Reevaluation of the patient after these medicines showed that the patient improved I have reviewed the patients home medicines and have made adjustments as needed   Test Considered:  Ct/us    Critical Interventions:  heparin    Consultations Obtained:  I requested consultation with the intensivist (Dr. Dub),  and discussed lab and imaging findings as well as pertinent plan -as pt is so stable  and vitals are nl, no need for thrombolytics or icu admission.  Admit to hospitalists.   Pt  d/w Dr. Charlton ([facility]) for admission   Problem List / ED Course:  Bilateral PE with heart strain:  pt is not hypoxic and vitals are nl.  Troponin only very slightly abnormal.  Pt started on heparin .  Pt will need admission.   Reevaluation:  After the interventions noted above, I reevaluated the patient and found that they have :improved   Social Determinants of Health:  Lives at home   Dispostion:  After consideration of the diagnostic results and the patients response to treatment, I feel that the patent would benefit from admission.    CRITICAL CARE Performed by: Mliss Boyers   Total critical care time: 30 minutes  Critical care time was exclusive of separately billable procedures and treating other patients.  Critical care was necessary to treat or prevent imminent or life-threatening deterioration.  Critical care was time spent personally by me on the following activities: development of treatment plan with patient and/or surrogate as well as nursing, discussions with consultants, evaluation of patient's response to treatment, examination of patient, obtaining history from patient or surrogate, ordering and performing treatments and interventions, ordering and review of laboratory studies, ordering and review of radiographic studies, pulse oximetry and re-evaluation of patient's condition.         Final Clinical Impression(s) / ED Diagnoses Final diagnoses:  Acute pulmonary embolism with acute cor pulmonale, unspecified pulmonary embolism type (HCC)  Acute deep vein thrombosis (DVT) of proximal vein of left lower extremity [facility])    Rx / DC Orders ED Discharge Orders     None         Boyers Mliss, MD 08/28/23 2152

## 2023-08-28 ENCOUNTER — Encounter (HOSPITAL_COMMUNITY): Payer: Self-pay | Admitting: Family Medicine

## 2023-08-28 DIAGNOSIS — I2609 Other pulmonary embolism with acute cor pulmonale: Secondary | ICD-10-CM | POA: Diagnosis not present

## 2023-08-28 DIAGNOSIS — N1831 Chronic kidney disease, stage 3a: Secondary | ICD-10-CM | POA: Diagnosis not present

## 2023-08-28 DIAGNOSIS — N179 Acute kidney failure, unspecified: Secondary | ICD-10-CM

## 2023-08-28 DIAGNOSIS — I824Y2 Acute embolism and thrombosis of unspecified deep veins of left proximal lower extremity: Secondary | ICD-10-CM

## 2023-08-28 DIAGNOSIS — J449 Chronic obstructive pulmonary disease, unspecified: Secondary | ICD-10-CM

## 2023-08-28 DIAGNOSIS — I2699 Other pulmonary embolism without acute cor pulmonale: Secondary | ICD-10-CM | POA: Diagnosis not present

## 2023-08-28 HISTORY — DX: Chronic obstructive pulmonary disease, unspecified: J44.9

## 2023-08-28 HISTORY — DX: Acute embolism and thrombosis of unspecified deep veins of left proximal lower extremity: I82.4Y2

## 2023-08-28 LAB — BASIC METABOLIC PANEL
Anion gap: 9 (ref 5–15)
BUN: 36 mg/dL — ABNORMAL HIGH (ref 8–23)
CO2: 21 mmol/L — ABNORMAL LOW (ref 22–32)
Calcium: 8.8 mg/dL — ABNORMAL LOW (ref 8.9–10.3)
Chloride: 111 mmol/L (ref 98–111)
Creatinine, Ser: 1.03 mg/dL (ref 0.61–1.24)
GFR, Estimated: >60 mL/min (ref >60)
Glucose, Bld: 92 mg/dL (ref 70–99)
Potassium: 3.8 mmol/L (ref 3.5–5.1)
Sodium: 141 mmol/L (ref 135–145)

## 2023-08-28 LAB — HEPARIN LEVEL (UNFRACTIONATED)
Heparin Unfractionated: 0.5 [IU]/mL (ref 0.30–0.70)
Heparin Unfractionated: 0.36 [IU]/mL (ref 0.30–0.70)
Heparin Unfractionated: 0.44 [IU]/mL (ref 0.30–0.70)

## 2023-08-28 LAB — CBC
HCT: 40 % (ref 39.0–52.0)
Hemoglobin: 13.3 g/dL (ref 13.0–17.0)
MCH: 29.1 pg (ref 26.0–34.0)
MCHC: 33.3 g/dL (ref 30.0–36.0)
MCV: 87.5 fL (ref 80.0–100.0)
Platelets: 168 10*3/uL (ref 150–400)
RBC: 4.57 MIL/uL (ref 4.22–5.81)
RDW: 14.1 % (ref 11.5–15.5)
WBC: 7.3 10*3/uL (ref 4.0–10.5)
nRBC: 0 % (ref 0.0–0.2)

## 2023-08-28 MED ORDER — ACETAMINOPHEN 325 MG PO TABS
650.0000 mg | ORAL_TABLET | Freq: Four times a day (QID) | ORAL | Status: DC | PRN
Start: 2023-08-28 — End: 2023-08-31
  Administered 2023-08-30: 650 mg via ORAL
  Filled 2023-08-28: qty 2

## 2023-08-28 MED ORDER — UMECLIDINIUM BROMIDE 62.5 MCG/ACT IN AEPB
1.0000 | INHALATION_SPRAY | Freq: Every day | RESPIRATORY_TRACT | Status: DC
  Administered 2023-08-28: 1 via RESPIRATORY_TRACT
  Filled 2023-08-28: qty 7

## 2023-08-28 MED ORDER — FLUTICASONE FUROATE-VILANTEROL 100-25 MCG/ACT IN AEPB
1.0000 | INHALATION_SPRAY | Freq: Every day | RESPIRATORY_TRACT | Status: DC
  Administered 2023-08-28: 1 via RESPIRATORY_TRACT
  Filled 2023-08-28 (×2): qty 28

## 2023-08-28 MED ORDER — SODIUM CHLORIDE 0.9% FLUSH
3.0000 mL | Freq: Two times a day (BID) | INTRAVENOUS | Status: DC
  Administered 2023-08-28 – 2023-08-31 (×6): 3 mL via INTRAVENOUS

## 2023-08-28 MED ORDER — PANTOPRAZOLE SODIUM 40 MG PO TBEC
40.0000 mg | DELAYED_RELEASE_TABLET | Freq: Two times a day (BID) | ORAL | Status: DC
  Administered 2023-08-28 – 2023-08-30 (×4): 40 mg via ORAL
  Filled 2023-08-28 (×5): qty 1

## 2023-08-28 MED ORDER — IPRATROPIUM-ALBUTEROL 0.5-2.5 (3) MG/3ML IN SOLN: 3.0000 mL | RESPIRATORY_TRACT | Status: DC | PRN

## 2023-08-28 MED ORDER — POLYETHYLENE GLYCOL 3350 17 G PO PACK
17.0000 g | PACK | Freq: Two times a day (BID) | ORAL | Status: AC
  Administered 2023-08-28 – 2023-08-29 (×3): 17 g via ORAL
  Filled 2023-08-28 (×2): qty 1

## 2023-08-28 MED ORDER — OXYCODONE HCL 5 MG PO TABS: 2.5000 mg | ORAL_TABLET | ORAL | Status: DC | PRN

## 2023-08-28 MED ORDER — SENNOSIDES-DOCUSATE SODIUM 8.6-50 MG PO TABS: 1.0000 | ORAL_TABLET | Freq: Every evening | ORAL | Status: DC | PRN

## 2023-08-28 MED ORDER — PANTOPRAZOLE SODIUM 40 MG PO TBEC: 40.0000 mg | DELAYED_RELEASE_TABLET | Freq: Every day | ORAL | Status: DC

## 2023-08-28 MED ORDER — ACETAMINOPHEN 650 MG RE SUPP: 650.0000 mg | Freq: Four times a day (QID) | RECTAL | Status: DC | PRN

## 2023-08-28 NOTE — Progress Notes (Signed)
 PHARMACY - ANTICOAGULATION CONSULT NOTE  Pharmacy Consult for heparin   Indication: pulmonary embolus and DVT  No Known Allergies  Patient Measurements: Height: 5' 5 (165.1 cm) Weight: 55.8 kg (123 lb) IBW/kg (Calculated) : 61.5 Heparin  Dosing Weight: 55.8 kg  Vital Signs: Temp: 97.9 F (36.6 C) (01/01 1323) Temp Source: Oral (01/01 1323) BP: 171/83 (01/01 1323) Pulse Rate: 85 (01/01 1323)  Labs: Recent Labs    08/26/23 0000 08/27/23 1853 08/27/23 1929 08/27/23 2044 08/28/23 0453 08/28/23 1351  HGB  --  14.8  --   --  13.3  --   HCT  --  45.4  --   --  40.0  --   PLT  --  191  --   --  168  --   LABPROT  --   --  14.4  --   --   --   INR  --   --  1.1  --   --   --   HEPARINUNFRC  --   --   --   --  0.50 0.36  CREATININE 1.43* 1.27*  --   --  1.03  --   TROPONINIHS  --  22*  --  26*  --   --     Estimated Creatinine Clearance: 41.4 mL/min (by C-G formula based on SCr of 1.03 mg/dL).   Medical History: Past Medical History:  Diagnosis Date   Bell's palsy    COPD (chronic obstructive pulmonary disease) (HCC) 08/28/2023   Kidney stones    UTI (urinary tract infection)    Assessment: 88 yo M with PE/DVT. Pharmacy consulted for heparin . No anticoagulants PTA.  Today, heparin  level therapeutic at 0.36, however, on lower end of goal. Nursing team reports no issues with heparin  infusion or signs/symptoms of bleeding noted. CBC stable - hgb 13.3 and plts 168.   Goal of Therapy:  Heparin  level 0.3-0.7 units/ml Monitor platelets by anticoagulation protocol: Yes   Plan:  Increase heparin  to 1,000 units/hr 8-heparin  level following dose increase  Check CBC and heparin  level daily Monitor for signs/symptoms of bleeding   Lum Laine, PharmD PGY1 Pharmacy Resident 08/28/2023 2:43 PM

## 2023-08-28 NOTE — ED Notes (Signed)
 ED TO INPATIENT HANDOFF REPORT  ED Nurse Name and Phone #: David [name] Name/Age/Gender David [name] 88 y.o. male Room/Bed: 046C/046C  Code Status   Code Status: Full Code  Home/SNF/Other Home Patient oriented to: self, place, time, and situation Is this baseline? Yes   Triage Complete: Triage complete  Chief Complaint Acute pulmonary embolism [facility]) [I26.99]  Triage Note Pt sent from cardiology office to rule out DVT and PE. Leg pain and swelling L>R.    Allergies No Known Allergies  Level of Care/Admitting Diagnosis ED Disposition     ED Disposition  Admit   Condition  --   Comment  Hospital Area: [facility] [[id number]]  Level of Care: Progressive [102]  Admit to Progressive based on following criteria: CARDIOVASCULAR & THORACIC of moderate stability with acute coronary syndrome symptoms/low risk myocardial infarction/hypertensive urgency/arrhythmias/heart failure potentially compromising stability and stable post cardiovascular intervention patients.  May admit patient to David [name] or David [name] if equivalent level of care is available:: Yes  Covid Evaluation: Asymptomatic - no recent exposure (last 10 days) testing not required  Diagnosis: Acute pulmonary embolism [facility]) [[id number]]  Admitting Physician: David [name] [[id number]]  Attending Physician: David [name] [[id number]]  Certification:: I certify this patient will need inpatient services for at least 2 midnights  Expected Medical Readiness: 08/30/2023          B Medical/Surgery History Past Medical History:  Diagnosis Date   Bell's palsy    COPD (chronic obstructive pulmonary disease) (HCC) 08/28/2023   Kidney stones    UTI (urinary tract infection)    Past Surgical History:  Procedure Laterality Date   TONSILLECTOMY       A IV Location/Drains/Wounds Patient Lines/Drains/Airways Status     Active Line/Drains/Airways     Name Placement date Placement time Site Days    Peripheral IV 08/27/23 20 G 1 Anterior;Proximal;Right Forearm 08/27/23  1932  Forearm  1   Peripheral IV 08/27/23 18 G Anterior;Left Forearm 08/27/23  2041  Forearm  1            Intake/Output Last 24 hours  Intake/Output Summary (Last 24 hours) at 08/28/2023 1205 Last data filed at 08/28/2023 0740 Gross per 24 hour  Intake --  Output 250 ml  Net -250 ml    Labs/Imaging Results for orders placed or performed during the hospital encounter of 08/27/23 (from the past 48 hours)  CBC with Differential     Status: None   Collection Time: 08/27/23  6:53 PM  Result Value Ref Range   WBC 8.7 4.0 - 10.5 K/uL   RBC 5.10 4.22 - 5.81 MIL/uL   Hemoglobin 14.8 13.0 - 17.0 g/dL   HCT 54.5 60.9 - 47.9 %   MCV 89.0 80.0 - 100.0 fL   MCH 29.0 26.0 - 34.0 pg   MCHC 32.6 30.0 - 36.0 g/dL   RDW 85.9 88.4 - 84.4 %   Platelets 191 150 - 400 K/uL   nRBC 0.0 0.0 - 0.2 %   Neutrophils Relative % 72 %   Neutro Abs 6.4 1.7 - 7.7 K/uL   Lymphocytes Relative 16 %   Lymphs Abs 1.4 0.7 - 4.0 K/uL   Monocytes Relative 6 %   Monocytes Absolute 0.5 0.1 - 1.0 K/uL   Eosinophils Relative 4 %   Eosinophils Absolute 0.3 0.0 - 0.5 K/uL   Basophils Relative 1 %   Basophils Absolute 0.1 0.0 - 0.1 K/uL   Immature Granulocytes 1 %  Abs Immature Granulocytes 0.04 0.00 - 0.07 K/uL    Comment: Performed at [facility], [address] [place], [place] [address]  Comprehensive metabolic panel     Status: Abnormal   Collection Time: 08/27/23  6:53 PM  Result Value Ref Range   Sodium 142 135 - 145 mmol/L   Potassium 4.0 3.5 - 5.1 mmol/L   Chloride 107 98 - 111 mmol/L   CO2 21 (L) 22 - 32 mmol/L   Glucose, Bld 114 (H) 70 - 99 mg/dL    Comment: Glucose reference range applies only to samples taken after fasting for at least 8 hours.   BUN 39 (H) 8 - 23 mg/dL   Creatinine, Ser 8.72 (H) 0.61 - 1.24 mg/dL   Calcium 9.3 8.9 - 89.6 mg/dL   Total Protein 6.5 6.5 - 8.1 g/dL   Albumin 3.6 3.5 - 5.0 g/dL   AST  23 15 - 41 U/L   ALT 14 0 - 44 U/L   Alkaline Phosphatase 59 38 - 126 U/L   Total Bilirubin 0.6 0.0 - 1.2 mg/dL   GFR, Estimated 55 (L) >60 mL/min    Comment: (NOTE) Calculated using the CKD-EPI Creatinine Equation (2021)    Anion gap 14 5 - 15    Comment: Performed at [facility], [address], [place], [place] [address]  Troponin I (High Sensitivity)     Status: Abnormal   Collection Time: 08/27/23  6:53 PM  Result Value Ref Range   Troponin I (High Sensitivity) 22 (H) <18 ng/L    Comment: (NOTE) Elevated high sensitivity troponin I (hsTnI) values and significant  changes across serial measurements may suggest ACS but many other  chronic and acute conditions are known to elevate hsTnI results.  Refer to the Links section for chest pain algorithms and additional  guidance. Performed at [facility], [address] [place], [place] [address]   Protime-INR     Status: None   Collection Time: 08/27/23  7:29 PM  Result Value Ref Range   Prothrombin Time 14.4 11.4 - 15.2 seconds   INR 1.1 0.8 - 1.2    Comment: (NOTE) INR goal varies based on device and disease states. Performed at [facility], [address] [place], [place] [address]   Troponin I (High Sensitivity)     Status: Abnormal   Collection Time: 08/27/23  8:44 PM  Result Value Ref Range   Troponin I (High Sensitivity) 26 (H) <18 ng/L    Comment: (NOTE) Elevated high sensitivity troponin I (hsTnI) values and significant  changes across serial measurements may suggest ACS but many other  chronic and acute conditions are known to elevate hsTnI results.  Refer to the Links section for chest pain algorithms and additional  guidance. Performed at [facility], [address] [place], [place] [address]   Heparin  level (unfractionated)     Status: None   Collection Time: 08/28/23  4:53 AM  Result Value Ref Range   Heparin  Unfractionated 0.50 0.30 - 0.70 IU/mL    Comment: (NOTE) The  clinical reportable range upper limit is being lowered to >1.10 to align with the FDA approved guidance for the current laboratory assay.  If heparin  results are below expected values, and patient dosage has  been confirmed, suggest follow up testing of antithrombin III levels. Performed at [facility], [address] [place], [place] [address]   Basic metabolic panel  Status: Abnormal   Collection Time: 08/28/23  4:53 AM  Result Value Ref Range   Sodium 141 135 - 145 mmol/L   Potassium 3.8 3.5 - 5.1 mmol/L   Chloride 111 98 - 111 mmol/L   CO2 21 (L) 22 - 32 mmol/L   Glucose, Bld 92 70 - 99 mg/dL    Comment: Glucose reference range applies only to samples taken after fasting for at least 8 hours.   BUN 36 (H) 8 - 23 mg/dL   Creatinine, Ser 8.96 0.61 - 1.24 mg/dL   Calcium 8.8 (L) 8.9 - 10.3 mg/dL   GFR, Estimated >39 >39 mL/min    Comment: (NOTE) Calculated using the CKD-EPI Creatinine Equation (2021)    Anion gap 9 5 - 15    Comment: Performed at [facility], [address], [place], [place] [address]  CBC     Status: None   Collection Time: 08/28/23  4:53 AM  Result Value Ref Range   WBC 7.3 4.0 - 10.5 K/uL   RBC 4.57 4.22 - 5.81 MIL/uL   Hemoglobin 13.3 13.0 - 17.0 g/dL   HCT 59.9 60.9 - 47.9 %   MCV 87.5 80.0 - 100.0 fL   MCH 29.1 26.0 - 34.0 pg   MCHC 33.3 30.0 - 36.0 g/dL   RDW 85.8 88.4 - 84.4 %   Platelets 168 150 - 400 K/uL   nRBC 0.0 0.0 - 0.2 %    Comment: Performed at [facility], [address], [place], [place] [address]   VAS US  LOWER EXTREMITY VENOUS (DVT) (ONLY [facility] & [facility]) Result Date: 08/28/2023  Lower Venous DVT Study Patient Name:  David [name]  Date of Exam:   08/27/2023 Medical Rec #: [id number]        Accession #:    [id number] Date of Birth: 05/06/1936        Patient Gender: M Patient Age:   32 years Exam Location:  [facility] Procedure:      VAS US  LOWER EXTREMITY VENOUS (DVT) Referring Phys: LYLE SMALL  --------------------------------------------------------------------------------  Indications: Swelling, and SOB.  Comparison Study: No previous exams Performing Technologist: Jody Hill RVT, RDMS  Examination Guidelines: A complete evaluation includes B-mode imaging, spectral Doppler, color Doppler, and power Doppler as needed of all accessible portions of each vessel. Bilateral testing is considered an integral part of a complete examination. Limited examinations for reoccurring indications may be performed as noted. The reflux portion of the exam is performed with the patient in reverse Trendelenburg.  +-----+---------------+---------+-----------+----------+--------------+ RIGHTCompressibilityPhasicitySpontaneityPropertiesThrombus Aging +-----+---------------+---------+-----------+----------+--------------+ CFV  Full           Yes      Yes                                 +-----+---------------+---------+-----------+----------+--------------+   +---------+---------------+---------+-----------+----------+--------------+ LEFT     CompressibilityPhasicitySpontaneityPropertiesThrombus Aging +---------+---------------+---------+-----------+----------+--------------+ CFV      Full           Yes      Yes                                 +---------+---------------+---------+-----------+----------+--------------+ SFJ      Partial  Acute          +---------+---------------+---------+-----------+----------+--------------+ FV Prox  Full           Yes      Yes                                 +---------+---------------+---------+-----------+----------+--------------+ FV Mid   Full           Yes      Yes                                 +---------+---------------+---------+-----------+----------+--------------+ FV DistalNone           No       No                   Acute           +---------+---------------+---------+-----------+----------+--------------+ PFV      Full                                                        +---------+---------------+---------+-----------+----------+--------------+ POP      None           No       No                   Acute          +---------+---------------+---------+-----------+----------+--------------+ PTV      None           No       No                   Acute          +---------+---------------+---------+-----------+----------+--------------+ PERO     None           No       No                   Acute          +---------+---------------+---------+-----------+----------+--------------+ Gastroc  None           No       No                                  +---------+---------------+---------+-----------+----------+--------------+ GSV      Partial        Yes      Yes                  Acute          +---------+---------------+---------+-----------+----------+--------------+ SSV      None           No       No                   Acute          +---------+---------------+---------+-----------+----------+--------------+    Summary: RIGHT: - No evidence of common femoral vein obstruction.   LEFT: - Findings consistent with acute deep vein thrombosis involving the SF junction, left femoral vein, left popliteal vein, left posterior tibial veins, and left peroneal veins. - Findings consistent with acute superficial  vein thrombosis involving the left great saphenous vein, and left small saphenous vein. Findings consistent with acute intramuscular thrombosis involving the left gastrocnemius veins. - No cystic structure found in the popliteal fossa.  *See table(s) above for measurements and observations. Electronically signed by Penne Colorado MD on 08/28/2023 at 10:31:02 AM.    Final    CT Angio Chest PE W and/or Wo Contrast Result Date: 08/27/2023 CLINICAL DATA:  Leg pain and shortness of breath, initial encounter  EXAM: CT ANGIOGRAPHY CHEST WITH CONTRAST TECHNIQUE: Multidetector CT imaging of the chest was performed using the standard protocol during bolus administration of intravenous contrast. Multiplanar CT image reconstructions and MIPs were obtained to evaluate the vascular anatomy. RADIATION DOSE REDUCTION: This exam was performed according to the departmental dose-optimization program which includes automated exposure control, adjustment of the mA and/or kV according to patient size and/or use of iterative reconstruction technique. CONTRAST:  75mL OMNIPAQUE  IOHEXOL  350 MG/ML SOLN COMPARISON:  Chest x-ray from the previous day. FINDINGS: Cardiovascular: Thoracic aorta demonstrates atherosclerotic calcifications without aneurysmal dilatation. The pulmonary artery demonstrates extensive bilateral pulmonary emboli with evidence of right heart strain within RV/LV ratio of 1.0. Coronary calcifications are noted. Cardiac enlargement is noted. Mediastinum/Nodes: Thoracic inlet is within normal limits. No hilar or mediastinal adenopathy is noted. The esophagus as visualized is within normal limits. Lungs/Pleura: Lungs are well aerated bilaterally. Mild emphysematous changes are noted. No focal infiltrate or sizable effusion is seen. Upper Abdomen: Visualized upper abdomen is within normal limits. Musculoskeletal: Degenerative changes of the thoracic spine are noted. Chronic compression deformity at T5 is noted. Review of the MIP images confirms the above findings. IMPRESSION: Extensive bilateral pulmonary embolism with evidence of right heart strain as described above. No other focal acute abnormality is noted. Aortic Atherosclerosis (ICD10-I70.0) and Emphysema (ICD10-J43.9). Critical Value/emergent results were called by telephone at the time of interpretation on 08/27/2023 at 8:20 pm to Dr. Dean , who verbally acknowledged these results. Electronically Signed   By: Oneil Devonshire M.D.   On: 08/27/2023 20:30   DG Chest 2  View Result Date: 08/26/2023 CLINICAL DATA:  Ongoing shortness of breath. Dyspnea on exertion. Bilateral leg edema. EXAM: CHEST - 2 VIEW COMPARISON:  12/17/2022 FINDINGS: Normal heart size. Aortic atherosclerotic calcifications. Mediastinal contours appear normal. Lungs are hyperinflated with chronic coarsened interstitial markings bilaterally. No superimposed pleural effusion, interstitial edema or airspace disease. No acute osseous findings. IMPRESSION: 1. No acute cardiopulmonary disease. 2. Chronic hyperinflation and coarsened interstitial markings suggestive of COPD. Electronically Signed   By: Waddell Calk M.D.   On: 08/26/2023 18:08    Pending Labs Unresulted Labs (From admission, onward)     Start     Ordered   08/29/23 0500  Heparin  level (unfractionated)  Daily,   R      08/27/23 2047   08/28/23 1400  Heparin  level (unfractionated)  Once-Timed,   TIMED        08/28/23 0533   08/28/23 0500  Basic metabolic panel  Daily,   R      08/28/23 0029   08/28/23 0500  CBC  Daily,   R      08/28/23 0029            Vitals/Pain Today's Vitals   08/28/23 0739 08/28/23 0745 08/28/23 0754 08/28/23 1015  BP:  (!) 149/86  (!) 143/59  Pulse:  (!) 59  75  Resp:  15  (!) 23  Temp:   98.3 F (36.8 C)   TempSrc:  Oral   SpO2:  97%  99%  Weight:      Height:      PainSc: 0-No pain       Isolation Precautions No active isolations  Medications Medications  heparin  ADULT infusion 100 units/mL (25000 units/250mL) (900 Units/hr Intravenous Rate/Dose Verify 08/28/23 0738)  fluticasone  furoate-vilanterol (BREO ELLIPTA ) 100-25 MCG/ACT 1 puff (1 puff Inhalation Given 08/28/23 0744)    And  umeclidinium bromide  (INCRUSE ELLIPTA ) 62.5 MCG/ACT 1 puff (1 puff Inhalation Given 08/28/23 0743)  ipratropium-albuterol  (DUONEB) 0.5-2.5 (3) MG/3ML nebulizer solution 3 mL (has no administration in time range)  sodium chloride  flush (NS) 0.9 % injection 3 mL (3 mLs Intravenous Not Given 08/28/23 1020)   acetaminophen  (TYLENOL ) tablet 650 mg (has no administration in time range)    Or  acetaminophen  (TYLENOL ) suppository 650 mg (has no administration in time range)  oxyCODONE  (Oxy IR/ROXICODONE ) immediate release tablet 2.5 mg (has no administration in time range)  senna-docusate (Senokot-S) tablet 1 tablet (has no administration in time range)  pantoprazole  (PROTONIX ) EC tablet 40 mg (has no administration in time range)  polyethylene glycol (MIRALAX  / GLYCOLAX ) packet 17 g (has no administration in time range)  iohexol  (OMNIPAQUE ) 350 MG/ML injection 75 mL (75 mLs Intravenous Contrast Given 08/27/23 2012)  heparin  bolus via infusion 3,800 Units (3,800 Units Intravenous Bolus from Bag 08/27/23 2110)    Mobility walks with person assist     Focused Assessments Cardiac Assessment Handoff:    No results found for: CKTOTAL, CKMB, CKMBINDEX, TROPONINI No results found for: DDIMER Does the Patient currently have chest pain? No    R Recommendations: See Admitting Provider Note  Report given to:   Additional Notes:

## 2023-08-28 NOTE — H&P (Signed)
 History and Physical    David [name] FMW:[id number] DOB: May 03, 1936 DOA: 08/27/2023  PCP: Towana Small, FNP   Patient coming from: Home   Chief Complaint: Leg swelling, SOB   HPI: David [name] is a 88 y.o. male with medical history significant for Bell's palsy, nephrolithiasis, and COPD who presents with worsening shortness of breath and left leg swelling.  Patient has been experiencing exertional dyspnea for at least a year but has worsened significantly in the past week and has also developed progressive left lower extremity swelling.  He has not experienced any chest discomfort, has not been coughing much, and denies fever or chills.  He denies any personal history of VTE and is unaware of any family history of clotting disorders.  There has not been any recent prolonged immobilization or surgery.  He denies any history of easy bruising or bleeding.  ED Course: Upon arrival to the ED, patient is found to be afebrile and saturating well on room air with mild tachypnea and stable blood pressure.  EKG demonstrates sinus rhythm with RBBB and frequent ectopy.  Labs are most notable for creatinine 1.27 and troponin 26.  Ultrasound reveals acute DVT throughout the left leg.  CTA chest is concerning for extensive bilateral PE with right heart strain.  ED physician discussed the case with PCCM (Dr. Dub) who did not see an indication for thrombolytics or ICU admission.  Patient was started on IV heparin .  Review of Systems:  All other systems reviewed and apart from HPI, are negative.  Past Medical History:  Diagnosis Date   Bell's palsy    COPD (chronic obstructive pulmonary disease) (HCC) 08/28/2023   Kidney stones    UTI (urinary tract infection)     Past Surgical History:  Procedure Laterality Date   TONSILLECTOMY      Social History:   reports that he has never smoked. He has never used smokeless tobacco. He reports that he does not drink alcohol and does not use  drugs.  No Known Allergies  Family History  Problem Relation Age of Onset   Heart disease Brother    Liver disease Neg Hx    Esophageal cancer Neg Hx    Colon cancer Neg Hx      Prior to Admission medications   Medication Sig Start Date End Date Taking? Authorizing Provider  bisoprolol  (ZEBETA ) 5 MG tablet Take 1 tablet (5 mg total) by mouth daily. 08/27/23  Yes Tolia, Sunit, DO  dextromethorphan (DELSYM) 30 MG/5ML liquid Take 15 mg by mouth as needed for cough.   Yes [provider]  Fluticasone -Umeclidin-Vilant (TRELEGY ELLIPTA ) 100-62.5-25 MCG/ACT AEPB Inhale 1 puff into the lungs daily. 08/27/23  Yes Butler, Kristina, FNP  furosemide  (LASIX ) 40 MG tablet Take 1 tablet (40 mg total) by mouth daily for 3 days, then take 1 daily if needed  for swelling or fluid/weight gain of 2 pounds overnight or 5 pounds in a week. Patient taking differently: Take 40 mg by mouth daily. Take 1 tablet every day; if swelling increases in legs, take 2 tablets for 3 days and then resume normal dose 11/02/22  Yes Lonni Slain, MD  ibuprofen (ADVIL) 200 MG tablet Take 200 mg by mouth as needed for mild pain (pain score 1-3).   Yes [provider]  ipratropium-albuterol  (DUONEB) 0.5-2.5 (3) MG/3ML SOLN Inhale 3 mLs by nebulization every 4 (four) hours as needed. 08/26/23  Yes Towana Small, FNP  omeprazole  (PRILOSEC) 20 MG capsule Take 1 capsule (20  mg total) by mouth 2 (two) times daily before a meal. Patient taking differently: Take 20 mg by mouth as needed (heartburn and GERD). 09/24/22  Yes EarlyCamie BRAVO, NP    Physical Exam: Vitals:   08/27/23 2340 08/27/23 2344 08/27/23 2349 08/28/23 0000  BP:    (!) 168/91  Pulse: 71 60  (!) 40  Resp: 17 16  17   Temp:   98.3 F (36.8 C)   TempSrc:   Oral   SpO2: 98% 97%  97%  Weight:      Height:        Constitutional: NAD, calm  Eyes: PERTLA, lids and conjunctivae normal ENMT: Mucous membranes are moist. Posterior pharynx clear  of any exudate or lesions.   Neck: supple, no masses  Respiratory: no wheezing, no crackles. No accessory muscle use.  Cardiovascular: S1 & S2 heard, regular rate and rhythm. LLE edema.   Abdomen: No distension, no tenderness, soft. Bowel sounds active.  Musculoskeletal: no clubbing / cyanosis. No joint deformity upper and lower extremities.   Skin: no significant rashes, lesions, ulcers. Warm, dry, well-perfused. Neurologic: CN 2-12 grossly intact. Moving all extremities. Alert and oriented.  Psychiatric: Pleasant. Cooperative.    Labs and Imaging on Admission: I have personally reviewed following labs and imaging studies  CBC: Recent Labs  Lab 08/27/23 1853  WBC 8.7  NEUTROABS 6.4  HGB 14.8  HCT 45.4  MCV 89.0  PLT 191   Basic Metabolic Panel: Recent Labs  Lab 08/26/23 0000 08/27/23 1853  NA 144 142  K 5.0 4.0  CL 106 107  CO2 22 21*  GLUCOSE 89 114*  BUN 28* 39*  CREATININE 1.43* 1.27*  CALCIUM 9.3 9.3   GFR: Estimated Creatinine Clearance: 33.6 mL/min (A) (by C-G formula based on SCr of 1.27 mg/dL (H)). Liver Function Tests: Recent Labs  Lab 08/27/23 1853  AST 23  ALT 14  ALKPHOS 59  BILITOT 0.6  PROT 6.5  ALBUMIN 3.6   No results for input(s): LIPASE, AMYLASE in the last 168 hours. No results for input(s): AMMONIA in the last 168 hours. Coagulation Profile: Recent Labs  Lab 08/27/23 1929  INR 1.1   Cardiac Enzymes: No results for input(s): CKTOTAL, CKMB, CKMBINDEX, TROPONINI in the last 168 hours. BNP (last 3 results) No results for input(s): PROBNP in the last 8760 hours. HbA1C: No results for input(s): HGBA1C in the last 72 hours. CBG: No results for input(s): GLUCAP in the last 168 hours. Lipid Profile: No results for input(s): CHOL, HDL, LDLCALC, TRIG, CHOLHDL, LDLDIRECT in the last 72 hours. Thyroid  Function Tests: No results for input(s): TSH, T4TOTAL, FREET4, T3FREE, THYROIDAB in the last 72  hours. Anemia Panel: No results for input(s): VITAMINB12, FOLATE, FERRITIN, TIBC, IRON, RETICCTPCT in the last 72 hours. Urine analysis:    Component Value Date/Time   COLORURINE AMBER (A) 11/08/2019 1534   APPEARANCEUR HAZY (A) 11/08/2019 1534   LABSPEC 1.028 11/08/2019 1534   PHURINE 6.0 11/08/2019 1534   GLUCOSEU NEGATIVE 11/08/2019 1534   HGBUR SMALL (A) 11/08/2019 1534   BILIRUBINUR NEGATIVE 11/08/2019 1534   KETONESUR 80 (A) 11/08/2019 1534   PROTEINUR 100 (A) 11/08/2019 1534   NITRITE NEGATIVE 11/08/2019 1534   LEUKOCYTESUR NEGATIVE 11/08/2019 1534   Sepsis Labs: @LABRCNTIP (procalcitonin:4,lacticidven:4) )No results found for this or any previous visit (from the past 240 hours).   Radiological Exams on Admission: CT Angio Chest PE W and/or Wo Contrast Result Date: 08/27/2023 CLINICAL DATA:  Leg pain and shortness  of breath, initial encounter EXAM: CT ANGIOGRAPHY CHEST WITH CONTRAST TECHNIQUE: Multidetector CT imaging of the chest was performed using the standard protocol during bolus administration of intravenous contrast. Multiplanar CT image reconstructions and MIPs were obtained to evaluate the vascular anatomy. RADIATION DOSE REDUCTION: This exam was performed according to the departmental dose-optimization program which includes automated exposure control, adjustment of the mA and/or kV according to patient size and/or use of iterative reconstruction technique. CONTRAST:  75mL OMNIPAQUE  IOHEXOL  350 MG/ML SOLN COMPARISON:  Chest x-ray from the previous day. FINDINGS: Cardiovascular: Thoracic aorta demonstrates atherosclerotic calcifications without aneurysmal dilatation. The pulmonary artery demonstrates extensive bilateral pulmonary emboli with evidence of right heart strain within RV/LV ratio of 1.0. Coronary calcifications are noted. Cardiac enlargement is noted. Mediastinum/Nodes: Thoracic inlet is within normal limits. No hilar or mediastinal adenopathy is noted.  The esophagus as visualized is within normal limits. Lungs/Pleura: Lungs are well aerated bilaterally. Mild emphysematous changes are noted. No focal infiltrate or sizable effusion is seen. Upper Abdomen: Visualized upper abdomen is within normal limits. Musculoskeletal: Degenerative changes of the thoracic spine are noted. Chronic compression deformity at T5 is noted. Review of the MIP images confirms the above findings. IMPRESSION: Extensive bilateral pulmonary embolism with evidence of right heart strain as described above. No other focal acute abnormality is noted. Aortic Atherosclerosis (ICD10-I70.0) and Emphysema (ICD10-J43.9). Critical Value/emergent results were called by telephone at the time of interpretation on 08/27/2023 at 8:20 pm to Dr. Dean , who verbally acknowledged these results. Electronically Signed   By: Oneil Devonshire M.D.   On: 08/27/2023 20:30   VAS US  LOWER EXTREMITY VENOUS (DVT) (ONLY [facility] & [facility]) Result Date: 08/27/2023  Lower Venous DVT Study Patient Name:  David [name]  Date of Exam:   08/27/2023 Medical Rec #: [id number]        Accession #:    [id number] Date of Birth: 01/30/1936        Patient Gender: M Patient Age:   16 years Exam Location:  [facility] Procedure:      VAS US  LOWER EXTREMITY VENOUS (DVT) Referring Phys: LYLE SMALL --------------------------------------------------------------------------------  Indications: Swelling, and SOB.  Comparison Study: No previous exams Performing Technologist: Jody Hill RVT, RDMS  Examination Guidelines: A complete evaluation includes B-mode imaging, spectral Doppler, color Doppler, and power Doppler as needed of all accessible portions of each vessel. Bilateral testing is considered an integral part of a complete examination. Limited examinations for reoccurring indications may be performed as noted. The reflux portion of the exam is performed with the patient in reverse Trendelenburg.   +-----+---------------+---------+-----------+----------+--------------+ RIGHTCompressibilityPhasicitySpontaneityPropertiesThrombus Aging +-----+---------------+---------+-----------+----------+--------------+ CFV  Full           Yes      Yes                                 +-----+---------------+---------+-----------+----------+--------------+   +---------+---------------+---------+-----------+----------+--------------+ LEFT     CompressibilityPhasicitySpontaneityPropertiesThrombus Aging +---------+---------------+---------+-----------+----------+--------------+ CFV      Full           Yes      Yes                                 +---------+---------------+---------+-----------+----------+--------------+ SFJ      Partial  Acute          +---------+---------------+---------+-----------+----------+--------------+ FV Prox  Full           Yes      Yes                                 +---------+---------------+---------+-----------+----------+--------------+ FV Mid   Full           Yes      Yes                                 +---------+---------------+---------+-----------+----------+--------------+ FV DistalNone           No       No                   Acute          +---------+---------------+---------+-----------+----------+--------------+ PFV      Full                                                        +---------+---------------+---------+-----------+----------+--------------+ POP      None           No       No                   Acute          +---------+---------------+---------+-----------+----------+--------------+ PTV      None           No       No                   Acute          +---------+---------------+---------+-----------+----------+--------------+ PERO     None           No       No                   Acute           +---------+---------------+---------+-----------+----------+--------------+ Gastroc  None           No       No                                  +---------+---------------+---------+-----------+----------+--------------+ GSV      Partial        Yes      Yes                  Acute          +---------+---------------+---------+-----------+----------+--------------+ SSV      None           No       No                   Acute          +---------+---------------+---------+-----------+----------+--------------+   Summary: RIGHT: - No evidence of common femoral vein obstruction.   LEFT: - Findings consistent with acute deep vein thrombosis involving the SF junction, left femoral vein, left popliteal vein, left posterior tibial veins, and left peroneal veins. - Findings consistent with acute superficial vein  thrombosis involving the left great saphenous vein, and left small saphenous vein. Findings consistent with acute intramuscular thrombosis involving the left gastrocnemius veins. - No cystic structure found in the popliteal fossa.  *See table(s) above for measurements and observations.    Preliminary    DG Chest 2 View Result Date: 08/26/2023 CLINICAL DATA:  Ongoing shortness of breath. Dyspnea on exertion. Bilateral leg edema. EXAM: CHEST - 2 VIEW COMPARISON:  12/17/2022 FINDINGS: Normal heart size. Aortic atherosclerotic calcifications. Mediastinal contours appear normal. Lungs are hyperinflated with chronic coarsened interstitial markings bilaterally. No superimposed pleural effusion, interstitial edema or airspace disease. No acute osseous findings. IMPRESSION: 1. No acute cardiopulmonary disease. 2. Chronic hyperinflation and coarsened interstitial markings suggestive of COPD. Electronically Signed   By: Waddell Calk M.D.   On: 08/26/2023 18:08    EKG: Independently reviewed. Sinus rhythm. PVCs, RBBB.   Assessment/Plan  1. Acute LLE DVT, b/l PE  - No provacative factor identified   - Continue IV heparin , check echocardiogram    2. COPD  - Not in exacerbation  - Continue ICS-LAMA-LABA    3. CKD 3A - Renally-dose medications   4. Frequent ectopy  - Started on bisoprolol  in cardiology clinic today  - Hold bisoprolol  pending echo given concern for acute right heart failure    DVT prophylaxis: IV heparin    Code Status: Full  Level of Care: Level of care: Progressive Family Communication: Daughter at bedside   Disposition Plan:  Patient is from: Home  Anticipated d/c is to: Home  Anticipated d/c date is: 08/30/23 Patient currently: Pending tolerance of anticoagulation, echo, transition to oral therapies  Consults called: None  Admission status: Inpatient     David [name] Sprinkles, MD [facility]  08/28/2023, 1:10 AM

## 2023-08-28 NOTE — Progress Notes (Signed)
 PHARMACY - ANTICOAGULATION CONSULT NOTE  Pharmacy Consult for heparin   Indication: pulmonary embolus and DVT  No Known Allergies  Patient Measurements: Height: 5' 5 (165.1 cm) Weight: 55.8 kg (123 lb) IBW/kg (Calculated) : 61.5 Heparin  Dosing Weight: 55.8 kg  Vital Signs: Temp: 98.3 F (36.8 C) (12/31 2349) Temp Source: Oral (12/31 2349) BP: 163/101 (01/01 0430) Pulse Rate: 72 (01/01 0430)  Labs: Recent Labs    08/26/23 0000 08/27/23 1853 08/27/23 1929 08/27/23 2044 08/28/23 0453  HGB  --  14.8  --   --  13.3  HCT  --  45.4  --   --  40.0  PLT  --  191  --   --  168  LABPROT  --   --  14.4  --   --   INR  --   --  1.1  --   --   HEPARINUNFRC  --   --   --   --  0.50  CREATININE 1.43* 1.27*  --   --  1.03  TROPONINIHS  --  22*  --  26*  --     Estimated Creatinine Clearance: 41.4 mL/min (by C-G formula based on SCr of 1.03 mg/dL).   Medical History: Past Medical History:  Diagnosis Date   Bell's palsy    COPD (chronic obstructive pulmonary disease) (HCC) 08/28/2023   Kidney stones    UTI (urinary tract infection)    Assessment: 88 yo M with PE/DVT. Pharmacy consulted for heparin . No anticoagulants PTA.  CBC WNL   1/1 AM update:  Heparin  level therapeutic   Goal of Therapy:  Heparin  level 0.3-0.7 units/ml Monitor platelets by anticoagulation protocol: Yes   Plan:  Cont heparin  900 units/hr 1400 heparin  level  Lynwood Mckusick, PharmD, BCPS Clinical Pharmacist Phone: [phone]

## 2023-08-28 NOTE — Progress Notes (Signed)
 [facility] PROGRESS NOTE    Progress Note  David [name]  FMW:[id number] DOB: [birth year] DOA: 08/27/2023 PCP: Towana Small, FNP     Brief Narrative:   David [name] is an 88 y.o. male past medical history significant for Bell's palsy, nephrolithiasis and COPD who presented with shortness of breath and leg swelling has gotten worse over the last week prior to admission with progressive lower extremity swelling.  CT angio of the chest showed extensive bilateral pulmonary embolism with right heart strain  Assessment/Plan:   Acute pulmonary embolism/ Acute deep vein thrombosis (DVT) of proximal vein of left lower extremity : Started on IV heparin , tachycardia has resolved we has been weaned to room air. Lower extremity Dopplers positive for right and left DVT. Cardiac biomarkers have basically remained flat. 2D echo is pending. Since he is stable he is not a candidate for thrombolytics.  Acute kidney injury: Likely prerenal with him at baseline hemoglobin less than 1 started on IV fluids his creatinine is improving.  COPD (chronic obstructive pulmonary disease) (HCC) Continue inhalers.  CKD stage 3a, GFR 45-59 ml/min (HCC) Creatinine is at baseline.  Frequent ectopy with bigeminal likely due to PE: Bisoprolol .  History of gastritis: Which was treated with PPI and his abdominal pain improved.  Unintentional weight loss: No alarming symptoms will need to follow-up with PCP as an outpatient.  DVT prophylaxis: heparin  Family Communication:none Status is: Inpatient Remains inpatient appropriate because: Acute PE    Code Status:     Code Status Orders  (From admission, onward)           Start     Ordered   08/28/23 0028  Full code  Continuous       Question:  By:  Answer:  Consent: discussion documented in EHR   08/28/23 0029           Code Status History     This patient has a current code status but no historical code status.          IV Access:   Peripheral IV   Procedures and diagnostic studies:   CT Angio Chest PE W and/or Wo Contrast Result Date: 08/27/2023 CLINICAL DATA:  Leg pain and shortness of breath, initial encounter EXAM: CT ANGIOGRAPHY CHEST WITH CONTRAST TECHNIQUE: Multidetector CT imaging of the chest was performed using the standard protocol during bolus administration of intravenous contrast. Multiplanar CT image reconstructions and MIPs were obtained to evaluate the vascular anatomy. RADIATION DOSE REDUCTION: This exam was performed according to the departmental dose-optimization program which includes automated exposure control, adjustment of the mA and/or kV according to patient size and/or use of iterative reconstruction technique. CONTRAST:  75mL OMNIPAQUE  IOHEXOL  350 MG/ML SOLN COMPARISON:  Chest x-ray from the previous day. FINDINGS: Cardiovascular: Thoracic aorta demonstrates atherosclerotic calcifications without aneurysmal dilatation. The pulmonary artery demonstrates extensive bilateral pulmonary emboli with evidence of right heart strain within RV/LV ratio of 1.0. Coronary calcifications are noted. Cardiac enlargement is noted. Mediastinum/Nodes: Thoracic inlet is within normal limits. No hilar or mediastinal adenopathy is noted. The esophagus as visualized is within normal limits. Lungs/Pleura: Lungs are well aerated bilaterally. Mild emphysematous changes are noted. No focal infiltrate or sizable effusion is seen. Upper Abdomen: Visualized upper abdomen is within normal limits. Musculoskeletal: Degenerative changes of the thoracic spine are noted. Chronic compression deformity at T5 is noted. Review of the MIP images confirms the above findings. IMPRESSION: Extensive bilateral pulmonary embolism with evidence of right heart strain as described above.  No other focal acute abnormality is noted. Aortic Atherosclerosis (ICD10-I70.0) and Emphysema (ICD10-J43.9). Critical Value/emergent results were  called by telephone at the time of interpretation on 08/27/2023 at 8:20 pm to Dr. Dean , who verbally acknowledged these results. Electronically Signed   By: Oneil Devonshire M.D.   On: 08/27/2023 20:30   VAS US  LOWER EXTREMITY VENOUS (DVT) (ONLY [facility] & [facility]) Result Date: 08/27/2023  Lower Venous DVT Study Patient Name:  David [name]  Date of Exam:   08/27/2023 Medical Rec #: [id number]        Accession #:    [id number] Date of Birth: 1935/10/03        Patient Gender: M Patient Age:   49 years Exam Location:  [facility] Procedure:      VAS US  LOWER EXTREMITY VENOUS (DVT) Referring Phys: LYLE SMALL --------------------------------------------------------------------------------  Indications: Swelling, and SOB.  Comparison Study: No previous exams Performing Technologist: Jody Hill RVT, RDMS  Examination Guidelines: A complete evaluation includes B-mode imaging, spectral Doppler, color Doppler, and power Doppler as needed of all accessible portions of each vessel. Bilateral testing is considered an integral part of a complete examination. Limited examinations for reoccurring indications may be performed as noted. The reflux portion of the exam is performed with the patient in reverse Trendelenburg.  +-----+---------------+---------+-----------+----------+--------------+ RIGHTCompressibilityPhasicitySpontaneityPropertiesThrombus Aging +-----+---------------+---------+-----------+----------+--------------+ CFV  Full           Yes      Yes                                 +-----+---------------+---------+-----------+----------+--------------+   +---------+---------------+---------+-----------+----------+--------------+ LEFT     CompressibilityPhasicitySpontaneityPropertiesThrombus Aging +---------+---------------+---------+-----------+----------+--------------+ CFV      Full           Yes      Yes                                  +---------+---------------+---------+-----------+----------+--------------+ SFJ      Partial                                      Acute          +---------+---------------+---------+-----------+----------+--------------+ FV Prox  Full           Yes      Yes                                 +---------+---------------+---------+-----------+----------+--------------+ FV Mid   Full           Yes      Yes                                 +---------+---------------+---------+-----------+----------+--------------+ FV DistalNone           No       No                   Acute          +---------+---------------+---------+-----------+----------+--------------+ PFV      Full                                                        +---------+---------------+---------+-----------+----------+--------------+  POP      None           No       No                   Acute          +---------+---------------+---------+-----------+----------+--------------+ PTV      None           No       No                   Acute          +---------+---------------+---------+-----------+----------+--------------+ PERO     None           No       No                   Acute          +---------+---------------+---------+-----------+----------+--------------+ Gastroc  None           No       No                                  +---------+---------------+---------+-----------+----------+--------------+ GSV      Partial        Yes      Yes                  Acute          +---------+---------------+---------+-----------+----------+--------------+ SSV      None           No       No                   Acute          +---------+---------------+---------+-----------+----------+--------------+   Summary: RIGHT: - No evidence of common femoral vein obstruction.   LEFT: - Findings consistent with acute deep vein thrombosis involving the SF junction, left femoral vein, left popliteal vein, left  posterior tibial veins, and left peroneal veins. - Findings consistent with acute superficial vein thrombosis involving the left great saphenous vein, and left small saphenous vein. Findings consistent with acute intramuscular thrombosis involving the left gastrocnemius veins. - No cystic structure found in the popliteal fossa.  *See table(s) above for measurements and observations.    Preliminary    DG Chest 2 View Result Date: 08/26/2023 CLINICAL DATA:  Ongoing shortness of breath. Dyspnea on exertion. Bilateral leg edema. EXAM: CHEST - 2 VIEW COMPARISON:  12/17/2022 FINDINGS: Normal heart size. Aortic atherosclerotic calcifications. Mediastinal contours appear normal. Lungs are hyperinflated with chronic coarsened interstitial markings bilaterally. No superimposed pleural effusion, interstitial edema or airspace disease. No acute osseous findings. IMPRESSION: 1. No acute cardiopulmonary disease. 2. Chronic hyperinflation and coarsened interstitial markings suggestive of COPD. Electronically Signed   By: Waddell Calk M.D.   On: 08/26/2023 18:08     Medical Consultants:   None.   Subjective:    David [name] he denies any chest pain he relates his shortness of breath is much improved this morning after they started heparin .  Objective:    Vitals:   08/28/23 0300 08/28/23 0330 08/28/23 0400 08/28/23 0430  BP: (!) 136/95 (!) 151/85 (!) 149/75 (!) 163/101  Pulse: (!) 35 83 (!) 33 72  Resp: (!) 23 18 13 15   Temp:      TempSrc:      SpO2: 96%  98% 98% 95%  Weight:      Height:       SpO2: 95 %  No intake or output data in the 24 hours ending 08/28/23 0708 Filed Weights   08/27/23 1908  Weight: 55.8 kg    Exam: General exam: In no acute distress, cachectic Respiratory system: Good air movement and clear to auscultation. Cardiovascular system: S1 & S2 heard, RRR. No JVD. Gastrointestinal system: Abdomen is nondistended, soft and nontender.  Extremities: No pedal  edema. Skin: No rashes, lesions or ulcers Psychiatry: Judgement and insight appear normal. Mood & affect appropriate.    Data Reviewed:    Labs: Basic Metabolic Panel: Recent Labs  Lab 08/26/23 0000 08/27/23 1853 08/28/23 0453  NA 144 142 141  K 5.0 4.0 3.8  CL 106 107 111  CO2 22 21* 21*  GLUCOSE 89 114* 92  BUN 28* 39* 36*  CREATININE 1.43* 1.27* 1.03  CALCIUM 9.3 9.3 8.8*   GFR Estimated Creatinine Clearance: 41.4 mL/min (by C-G formula based on SCr of 1.03 mg/dL). Liver Function Tests: Recent Labs  Lab 08/27/23 1853  AST 23  ALT 14  ALKPHOS 59  BILITOT 0.6  PROT 6.5  ALBUMIN 3.6   No results for input(s): LIPASE, AMYLASE in the last 168 hours. No results for input(s): AMMONIA in the last 168 hours. Coagulation profile Recent Labs  Lab 08/27/23 1929  INR 1.1   COVID-19 Labs  No results for input(s): DDIMER, FERRITIN, LDH, CRP in the last 72 hours.  No results found for: SARSCOV2NAA  CBC: Recent Labs  Lab 08/27/23 1853 08/28/23 0453  WBC 8.7 7.3  NEUTROABS 6.4  --   HGB 14.8 13.3  HCT 45.4 40.0  MCV 89.0 87.5  PLT 191 168   Cardiac Enzymes: No results for input(s): CKTOTAL, CKMB, CKMBINDEX, TROPONINI in the last 168 hours. BNP (last 3 results) No results for input(s): PROBNP in the last 8760 hours. CBG: No results for input(s): GLUCAP in the last 168 hours. D-Dimer: No results for input(s): DDIMER in the last 72 hours. Hgb A1c: No results for input(s): HGBA1C in the last 72 hours. Lipid Profile: No results for input(s): CHOL, HDL, LDLCALC, TRIG, CHOLHDL, LDLDIRECT in the last 72 hours. Thyroid  function studies: No results for input(s): TSH, T4TOTAL, T3FREE, THYROIDAB in the last 72 hours.  Invalid input(s): FREET3 Anemia work up: No results for input(s): VITAMINB12, FOLATE, FERRITIN, TIBC, IRON, RETICCTPCT in the last 72 hours. Sepsis Labs: Recent Labs  Lab  08/27/23 1853 08/28/23 0453  WBC 8.7 7.3   Microbiology No results found for this or any previous visit (from the past 240 hours).   Medications:    fluticasone  furoate-vilanterol  1 puff Inhalation Daily   And   umeclidinium bromide   1 puff Inhalation Daily   pantoprazole   40 mg Oral Daily   sodium chloride  flush  3 mL Intravenous Q12H   Continuous Infusions:  heparin  900 Units/hr (08/27/23 2110)      LOS: 1 day   David [name]  [facility]  08/28/2023, 7:09 AM

## 2023-08-28 NOTE — Progress Notes (Signed)
 PHARMACY - ANTICOAGULATION CONSULT NOTE  Pharmacy Consult for heparin  Indication:  PE/DVT  Labs: Recent Labs    08/26/23 0000 08/27/23 1853 08/27/23 1929 08/27/23 2044 08/28/23 0453 08/28/23 1351 08/28/23 2226  HGB  --  14.8  --   --  13.3  --   --   HCT  --  45.4  --   --  40.0  --   --   PLT  --  191  --   --  168  --   --   LABPROT  --   --  14.4  --   --   --   --   INR  --   --  1.1  --   --   --   --   HEPARINUNFRC  --   --   --   --  0.50 0.36 0.44  CREATININE 1.43* 1.27*  --   --  1.03  --   --   TROPONINIHS  --  22*  --  26*  --   --   --   Assessment/Plan:  88yo male therapeutic on heparin  after rate change. Will continue infusion at current rate of 1000 units/hr and confirm stable with am labs.  Marvetta Dauphin, PharmD, BCPS 08/28/2023 11:55 PM

## 2023-08-29 ENCOUNTER — Inpatient Hospital Stay (HOSPITAL_COMMUNITY): Payer: Medicare Other

## 2023-08-29 DIAGNOSIS — I2609 Other pulmonary embolism with acute cor pulmonale: Secondary | ICD-10-CM | POA: Diagnosis not present

## 2023-08-29 DIAGNOSIS — I2699 Other pulmonary embolism without acute cor pulmonale: Secondary | ICD-10-CM | POA: Diagnosis not present

## 2023-08-29 DIAGNOSIS — N1831 Chronic kidney disease, stage 3a: Secondary | ICD-10-CM | POA: Diagnosis not present

## 2023-08-29 DIAGNOSIS — I824Y2 Acute embolism and thrombosis of unspecified deep veins of left proximal lower extremity: Secondary | ICD-10-CM | POA: Diagnosis not present

## 2023-08-29 LAB — CBC
HCT: 40.8 % (ref 39.0–52.0)
Hemoglobin: 13.6 g/dL (ref 13.0–17.0)
MCH: 29.2 pg (ref 26.0–34.0)
MCHC: 33.3 g/dL (ref 30.0–36.0)
MCV: 87.7 fL (ref 80.0–100.0)
Platelets: 177 10*3/uL (ref 150–400)
RBC: 4.65 MIL/uL (ref 4.22–5.81)
RDW: 14.2 % (ref 11.5–15.5)
WBC: 7.7 10*3/uL (ref 4.0–10.5)
nRBC: 0 % (ref 0.0–0.2)

## 2023-08-29 LAB — ECHOCARDIOGRAM COMPLETE
AR max vel: 3.3 cm2
AV Area VTI: 3.25 cm2
AV Area mean vel: 3.24 cm2
AV Mean grad: 2 mm[Hg]
AV Peak grad: 3.5 mm[Hg]
Ao pk vel: 0.93 m/s
Area-P 1/2: 4.52 cm2
Calc EF: 42.6 %
Height: 65 in
P 1/2 time: 585 ms
S' Lateral: 4.5 cm
Single Plane A2C EF: 33 %
Single Plane A4C EF: 46.8 %
Weight: 1968 [oz_av]

## 2023-08-29 LAB — BASIC METABOLIC PANEL
Anion gap: 10 (ref 5–15)
BUN: 28 mg/dL — ABNORMAL HIGH (ref 8–23)
CO2: 22 mmol/L (ref 22–32)
Calcium: 8.3 mg/dL — ABNORMAL LOW (ref 8.9–10.3)
Chloride: 108 mmol/L (ref 98–111)
Creatinine, Ser: 1.13 mg/dL (ref 0.61–1.24)
GFR, Estimated: >60 mL/min (ref >60)
Glucose, Bld: 99 mg/dL (ref 70–99)
Potassium: 4 mmol/L (ref 3.5–5.1)
Sodium: 140 mmol/L (ref 135–145)

## 2023-08-29 LAB — HEPARIN LEVEL (UNFRACTIONATED): Heparin Unfractionated: 0.55 [IU]/mL (ref 0.30–0.70)

## 2023-08-29 MED ORDER — BISOPROLOL FUMARATE 5 MG PO TABS
5.0000 mg | ORAL_TABLET | Freq: Every day | ORAL | Status: DC
Start: 2023-08-29 — End: 2023-08-31
  Administered 2023-08-29 – 2023-08-31 (×3): 5 mg via ORAL
  Filled 2023-08-29 (×3): qty 1

## 2023-08-29 NOTE — Evaluation (Signed)
 Physical Therapy Evaluation/ Discharge Patient Details Name: David [name] MRN: [id number] DOB: 05/23/1936 Today's Date: 08/29/2023  History of Present Illness  88 yo male admitted 12/31 with SOB, LLE edema, DVT and bil PE. PMHx: COPD, nephrolithiasis, Bells palsy, RBBB  Clinical Impression  Pt very pleasant and eager to return home. Pt lives alone with assist from daughter and tends to furniture walk at baseline. Pt with mild balance deficits requiring CGA during gait with recommendation for cane use. Pt maintaining SPO2 on RA >95% throughout. Daughter available to assist at DC and pt generally walking in yard daily. All education complete and no further acute needs. Recommend daily ambulation, defer to mobility specialist.        If plan is discharge home, recommend the following: Assistance with cooking/housework;Assist for transportation   Can travel by private vehicle        Equipment Recommendations Cane  Recommendations for Other Services       Functional Status Assessment Patient has not had a recent decline in their functional status     Precautions / Restrictions Precautions Precautions: Fall      Mobility  Bed Mobility               General bed mobility comments: in chair on arrival and end of session    Transfers Overall transfer level: Modified independent                      Ambulation/Gait Ambulation/Gait assistance: Contact guard assist Gait Distance (Feet): 400 Feet Assistive device: None Gait Pattern/deviations: Step-through pattern, WFL(Within Functional Limits)   Gait velocity interpretation: 1.31 - 2.62 ft/sec, indicative of limited community ambulator   General Gait Details: 3 partial LOB with tactile cues to correct and recommendation for cane use for future ambulation  Stairs Stairs: Yes Stairs assistance: Modified independent (Device/Increase time) Stair Management: One rail Right, Alternating pattern, Forwards Number of  Stairs: 2    Wheelchair Mobility     Tilt Bed    Modified Rankin (Stroke Patients Only)       Balance Overall balance assessment: Needs assistance, History of Falls   Sitting balance-Leahy Scale: Good     Standing balance support: No upper extremity supported, During functional activity Standing balance-Leahy Scale: Good Standing balance comment: static standing without assist, unsteady with partial LOB during gait                             Pertinent Vitals/Pain Pain Assessment Pain Assessment: No/denies pain    Home Living Family/patient expects to be discharged to:: Private residence Living Arrangements: Alone Available Help at Discharge: Family;Available PRN/intermittently Type of Home: House Home Access: Stairs to enter   [employer] of Steps: 2   Home Layout: One level Home Equipment: None      Prior Function Prior Level of Function : Needs assist;History of Falls (last six months)             Mobility Comments: no AD ADLs Comments: Independent in ADL; daughter assists with cooking, cleaning, and provides check list for medications that pt needs to take     Extremity/Trunk Assessment   Upper Extremity Assessment Upper Extremity Assessment: Overall WFL for tasks assessed    Lower Extremity Assessment Lower Extremity Assessment: Overall WFL for tasks assessed    Cervical / Trunk Assessment Cervical / Trunk Assessment: Normal  Communication   Communication Communication: No apparent difficulties  Cognition Arousal:  Alert Behavior During Therapy: WFL for tasks assessed/performed Overall Cognitive Status: Within Functional Limits for tasks assessed                                          General Comments      Exercises     Assessment/Plan    PT Assessment Patient does not need any further PT services  PT Problem List         PT Treatment Interventions      PT Goals (Current goals can  be found in the Care Plan section)  Acute Rehab PT Goals PT Goal Formulation: All assessment and education complete, DC therapy    Frequency       Co-evaluation               AM-PAC PT 6 Clicks Mobility  Outcome Measure Help needed turning from your back to your side while in a flat bed without using bedrails?: None Help needed moving from lying on your back to sitting on the side of a flat bed without using bedrails?: None Help needed moving to and from a bed to a chair (including a wheelchair)?: None Help needed standing up from a chair using your arms (e.g., wheelchair or bedside chair)?: None Help needed to walk in hospital room?: A Little Help needed climbing 3-5 steps with a railing? : A Little 6 Click Score: 22    End of Session   Activity Tolerance: Patient tolerated treatment well Patient left: in chair;with call bell/phone within reach;with family/visitor present Nurse Communication: Mobility status PT Visit Diagnosis: Other abnormalities of gait and mobility (R26.89)    Time: 1150-1206 PT Time Calculation (min) (ACUTE ONLY): 16 min   Charges:   PT Evaluation $PT Eval Low Complexity: 1 Low   PT General Charges $$ ACUTE PT VISIT: 1 Visit         [name], PT Acute Rehabilitation Services Office: [phone]   [name] 08/29/2023, 12:13 PM

## 2023-08-29 NOTE — Plan of Care (Signed)
  Problem: Education: Goal: Knowledge of General Education information will improve Description: Including pain rating scale, medication(s)/side effects and non-pharmacologic comfort measures Outcome: Progressing   Problem: Clinical Measurements: Goal: Ability to maintain clinical measurements within normal limits will improve Outcome: Progressing Goal: Will remain free from infection Outcome: Progressing Goal: Diagnostic test results will improve Outcome: Progressing Goal: Respiratory complications will improve Outcome: Progressing Goal: Cardiovascular complication will be avoided Outcome: Progressing   Problem: Activity: Goal: Risk for activity intolerance will decrease Outcome: Progressing   Problem: Nutrition: Goal: Adequate nutrition will be maintained Outcome: Progressing   Problem: Coping: Goal: Level of anxiety will decrease Outcome: Progressing   Problem: Elimination: Goal: Will not experience complications related to bowel motility Outcome: Progressing Goal: Will not experience complications related to urinary retention Outcome: Progressing   Problem: Pain Management: Goal: General experience of comfort will improve Outcome: Progressing

## 2023-08-29 NOTE — Plan of Care (Signed)
  Problem: Health Behavior/Discharge Planning: Goal: Ability to manage health-related needs will improve Outcome: Progressing   Problem: Clinical Measurements: Goal: Will remain free from infection Outcome: Progressing Goal: Diagnostic test results will improve Outcome: Progressing Goal: Respiratory complications will improve Outcome: Progressing   

## 2023-08-29 NOTE — Evaluation (Signed)
 Occupational Therapy Evaluation Patient Details Name: David [name] MRN: [id number] DOB: 31-Jul-1936 Today's Date: 08/29/2023   History of Present Illness 88 yo male admitted 12/31 with SOB, LLE edema, DVT and bil PE. PMHx: COPD, nephrolithiasis, Bells palsy, RBBB   Clinical Impression   PTA, pt lived alone and was mod I for ADL; daughter assisted with transportation, cooking, cleaning, and medication management. Upon eval, pt with generalized weakness, decreased balance, and activity tolerance. Pt needing up to CGA for BADL within his room. Pt favoring LLE throughout and have recommended PT consult. Pt denied need for AD but believe he could benefit. Do not suspect need for follow up after discharge. OT to sign off. Please re-consult if change in status.       If plan is discharge home, recommend the following: A little help with walking and/or transfers;A little help with bathing/dressing/bathroom;Help with stairs or ramp for entrance;Assist for transportation;Direct supervision/assist for medications management;Direct supervision/assist for financial management;Assistance with cooking/housework    Functional Status Assessment  Patient has had a recent decline in their functional status and demonstrates the ability to make significant improvements in function in a reasonable and predictable amount of time.  Equipment Recommendations  None recommended by OT    Recommendations for Other Services PT consult     Precautions / Restrictions Precautions Precautions: Fall      Mobility Bed Mobility Overal bed mobility: Modified Independent                  Transfers Overall transfer level: Needs assistance Equipment used: None Transfers: Sit to/from Stand Sit to Stand: Supervision           General transfer comment: for STS      Balance Overall balance assessment: Needs assistance Sitting-balance support: No upper extremity supported, Feet supported Sitting  balance-Leahy Scale: Good     Standing balance support: No upper extremity supported, During functional activity Standing balance-Leahy Scale: Fair Standing balance comment: CGA dynamically for short distance                           ADL either performed or assessed with clinical judgement   ADL Overall ADL's : Needs assistance/impaired Eating/Feeding: Independent   Grooming: Contact guard assist;Standing   Upper Body Bathing: Sitting;Modified independent   Lower Body Bathing: Contact guard assist;Sit to/from stand   Upper Body Dressing : Modified independent;Sitting   Lower Body Dressing: Contact guard assist;Sit to/from stand   Toilet Transfer: Contact guard assist;Ambulation   Toileting- Clothing Manipulation and Hygiene: Contact guard assist;Sit to/from stand       Functional mobility during ADLs: Contact guard assist General ADL Comments: without AD but with LLE discomfort may benefit from AD. very mild DOE     Vision Ability to See in Adequate Light: 0 Adequate Patient Visual Report: No change from baseline Vision Assessment?: No apparent visual deficits Additional Comments: able to read signage on wall, but pt reports vision has been declining     Perception Perception: Not tested       Praxis Praxis: Not tested       Pertinent Vitals/Pain Pain Assessment Pain Assessment: Faces Faces Pain Scale: Hurts a little bit Pain Location: LLE Pain Descriptors / Indicators: Sore, Discomfort Pain Intervention(s): Limited activity within patient's tolerance, Monitored during session     Extremity/Trunk Assessment Upper Extremity Assessment Upper Extremity Assessment: Generalized weakness (4/5 bilaterally)   Lower Extremity Assessment Lower Extremity Assessment: Defer to PT  evaluation       Communication Communication Communication: No apparent difficulties   Cognition Arousal: Alert Behavior During Therapy: WFL for tasks  assessed/performed Overall Cognitive Status: Within Functional Limits for tasks assessed                                 General Comments: for basic tasks, passed Short blessed test, but some decreased safety awareness and daughter has taken over some IADL     General Comments       Exercises     Shoulder Instructions      Home Living Family/patient expects to be discharged to:: Private residence Living Arrangements: Alone Available Help at Discharge: Family Type of Home: House Home Access: Stairs to enter Secretary/administrator of Steps: 2   Home Layout: One level     Bathroom Shower/Tub: Producer, Television/film/video: Standard     Home Equipment: None          Prior Functioning/Environment Prior Level of Function : Needs assist;History of Falls (last six months)             Mobility Comments: no AD ADLs Comments: Independent in ADL; daughter assists with cooking, cleaning, and provides check list for medications that pt needs to take        OT Problem List: Decreased strength;Decreased activity tolerance;Impaired balance (sitting and/or standing);Cardiopulmonary status limiting activity      OT Treatment/Interventions:      OT Goals(Current goals can be found in the care plan section) Acute Rehab OT Goals Patient Stated Goal: go home OT Goal Formulation: With patient Time For Goal Achievement: 09/12/23 Potential to Achieve Goals: Good  OT Frequency:      Co-evaluation              AM-PAC OT 6 Clicks Daily Activity     Outcome Measure Help from another person eating meals?: None Help from another person taking care of personal grooming?: A Little Help from another person toileting, which includes using toliet, bedpan, or urinal?: A Little Help from another person bathing (including washing, rinsing, drying)?: A Little Help from another person to put on and taking off regular upper body clothing?: A Little Help from  another person to put on and taking off regular lower body clothing?: A Little 6 Click Score: 19   End of Session Equipment Utilized During Treatment: Gait belt;Rolling walker (2 wheels) Nurse Communication: Mobility status  Activity Tolerance: Patient tolerated treatment well Patient left: in chair;Other (comment) (PT in room)  OT Visit Diagnosis: Unsteadiness on feet (R26.81);Muscle weakness (generalized) (M62.81);History of falling (Z91.81)                Time: 1133-1150 OT Time Calculation (min): 17 min Charges:  OT General Charges $OT Visit: 1 Visit OT Evaluation $OT Eval Low Complexity: 1 Low  Elma JONETTA Lebron FREDERICK, OTR/L [facility] Acute Rehabilitation Office: [phone]   Elma JONETTA Lebron 08/29/2023, 12:06 PM

## 2023-08-29 NOTE — Progress Notes (Signed)
 [facility] PROGRESS NOTE    Progress Note  David [name]  FMW:[id number] DOB: 1935-09-28 DOA: 08/27/2023 PCP: Towana Small, FNP     Brief Narrative:   David [name] is an 88 y.o. male past medical history significant for Bell's palsy, nephrolithiasis and COPD who presented with shortness of breath and leg swelling has gotten worse over the last week prior to admission with progressive lower extremity swelling.  CT angio of the chest showed extensive bilateral pulmonary embolism with right heart strain  Assessment/Plan:   Acute pulmonary embolism/ Acute deep vein thrombosis (DVT) of proximal vein of left lower extremity : Started on IV heparin , tachycardia has resolved we has been weaned to room air. Lower extremity Dopplers positive for right and left DVT. Cardiac biomarkers have basically remained flat. 2D echo results are pending. Since he is stable he is not a candidate for thrombolytics. Discontinue telemetry and pulse ox symmetry transferred to MedSurg. Heart rate in respiration have improved he has been weaned to room air. Can probably transition him to a DOAC in 24 hours. PT OT evaluation is pending, he refuses skilled nursing facility he wants to go home.  Acute kidney injury: Likely prerenal with him at baseline hemoglobin less than 1 started on IV fluids his creatinine is improving.  COPD (chronic obstructive pulmonary disease) (HCC) Continue inhalers.  CKD stage 3a, GFR 45-59 ml/min (HCC) Creatinine is at baseline.  Frequent ectopy with bigeminal likely due to PE: Bisoprolol .  History of gastritis: Which was treated with PPI and his abdominal pain improved.  Unintentional weight loss: No alarming symptoms will need to follow-up with PCP as an outpatient.  DVT prophylaxis: heparin  Family Communication:none Status is: Inpatient Remains inpatient appropriate because: Acute PE    Code Status:     Code Status Orders  (From admission,  onward)           Start     Ordered   08/28/23 0028  Full code  Continuous       Question:  By:  Answer:  Consent: discussion documented in EHR   08/28/23 0029           Code Status History     This patient has a current code status but no historical code status.         IV Access:   Peripheral IV   Procedures and diagnostic studies:   VAS US  LOWER EXTREMITY VENOUS (DVT) (ONLY [facility] & [facility]) Result Date: 08/28/2023  Lower Venous DVT Study Patient Name:  David [name]  Date of Exam:   08/27/2023 Medical Rec #: [id number]        Accession #:    [id number] Date of Birth: 1936/08/04        Patient Gender: M Patient Age:   43 years Exam Location:  [facility] Procedure:      VAS US  LOWER EXTREMITY VENOUS (DVT) Referring Phys: LYLE SMALL --------------------------------------------------------------------------------  Indications: Swelling, and SOB.  Comparison Study: No previous exams Performing Technologist: Jody Hill RVT, RDMS  Examination Guidelines: A complete evaluation includes B-mode imaging, spectral Doppler, color Doppler, and power Doppler as needed of all accessible portions of each vessel. Bilateral testing is considered an integral part of a complete examination. Limited examinations for reoccurring indications may be performed as noted. The reflux portion of the exam is performed with the patient in reverse Trendelenburg.  +-----+---------------+---------+-----------+----------+--------------+ RIGHTCompressibilityPhasicitySpontaneityPropertiesThrombus Aging +-----+---------------+---------+-----------+----------+--------------+ CFV  Full           Yes  Yes                                 +-----+---------------+---------+-----------+----------+--------------+   +---------+---------------+---------+-----------+----------+--------------+ LEFT     CompressibilityPhasicitySpontaneityPropertiesThrombus Aging  +---------+---------------+---------+-----------+----------+--------------+ CFV      Full           Yes      Yes                                 +---------+---------------+---------+-----------+----------+--------------+ SFJ      Partial                                      Acute          +---------+---------------+---------+-----------+----------+--------------+ FV Prox  Full           Yes      Yes                                 +---------+---------------+---------+-----------+----------+--------------+ FV Mid   Full           Yes      Yes                                 +---------+---------------+---------+-----------+----------+--------------+ FV DistalNone           No       No                   Acute          +---------+---------------+---------+-----------+----------+--------------+ PFV      Full                                                        +---------+---------------+---------+-----------+----------+--------------+ POP      None           No       No                   Acute          +---------+---------------+---------+-----------+----------+--------------+ PTV      None           No       No                   Acute          +---------+---------------+---------+-----------+----------+--------------+ PERO     None           No       No                   Acute          +---------+---------------+---------+-----------+----------+--------------+ Gastroc  None           No       No                                  +---------+---------------+---------+-----------+----------+--------------+ GSV  Partial        Yes      Yes                  Acute          +---------+---------------+---------+-----------+----------+--------------+ SSV      None           No       No                   Acute          +---------+---------------+---------+-----------+----------+--------------+    Summary: RIGHT: - No evidence of common  femoral vein obstruction.   LEFT: - Findings consistent with acute deep vein thrombosis involving the SF junction, left femoral vein, left popliteal vein, left posterior tibial veins, and left peroneal veins. - Findings consistent with acute superficial vein thrombosis involving the left great saphenous vein, and left small saphenous vein. Findings consistent with acute intramuscular thrombosis involving the left gastrocnemius veins. - No cystic structure found in the popliteal fossa.  *See table(s) above for measurements and observations. Electronically signed by Penne Colorado MD on 08/28/2023 at 10:31:02 AM.    Final    CT Angio Chest PE W and/or Wo Contrast Result Date: 08/27/2023 CLINICAL DATA:  Leg pain and shortness of breath, initial encounter EXAM: CT ANGIOGRAPHY CHEST WITH CONTRAST TECHNIQUE: Multidetector CT imaging of the chest was performed using the standard protocol during bolus administration of intravenous contrast. Multiplanar CT image reconstructions and MIPs were obtained to evaluate the vascular anatomy. RADIATION DOSE REDUCTION: This exam was performed according to the departmental dose-optimization program which includes automated exposure control, adjustment of the mA and/or kV according to patient size and/or use of iterative reconstruction technique. CONTRAST:  75mL OMNIPAQUE  IOHEXOL  350 MG/ML SOLN COMPARISON:  Chest x-ray from the previous day. FINDINGS: Cardiovascular: Thoracic aorta demonstrates atherosclerotic calcifications without aneurysmal dilatation. The pulmonary artery demonstrates extensive bilateral pulmonary emboli with evidence of right heart strain within RV/LV ratio of 1.0. Coronary calcifications are noted. Cardiac enlargement is noted. Mediastinum/Nodes: Thoracic inlet is within normal limits. No hilar or mediastinal adenopathy is noted. The esophagus as visualized is within normal limits. Lungs/Pleura: Lungs are well aerated bilaterally. Mild emphysematous changes are  noted. No focal infiltrate or sizable effusion is seen. Upper Abdomen: Visualized upper abdomen is within normal limits. Musculoskeletal: Degenerative changes of the thoracic spine are noted. Chronic compression deformity at T5 is noted. Review of the MIP images confirms the above findings. IMPRESSION: Extensive bilateral pulmonary embolism with evidence of right heart strain as described above. No other focal acute abnormality is noted. Aortic Atherosclerosis (ICD10-I70.0) and Emphysema (ICD10-J43.9). Critical Value/emergent results were called by telephone at the time of interpretation on 08/27/2023 at 8:20 pm to Dr. Dean , who verbally acknowledged these results. Electronically Signed   By: Oneil Devonshire M.D.   On: 08/27/2023 20:30     Medical Consultants:   None.   Subjective:    David [name] relates breathing is significantly better night and day compared to yesterday.  Objective:    Vitals:   08/28/23 1701 08/28/23 1951 08/28/23 2309 08/29/23 0436  BP: (!) 156/87 (!) 174/63 132/62 (!) 126/102  Pulse: 80 70 73 (!) 33  Resp: 12 13 18  (!) 9  Temp: 99 F (37.2 C) 97.6 F (36.4 C) (!) 97.4 F (36.3 C)   TempSrc: Oral Oral Oral   SpO2:  95% 97% 100%  Weight:      Height:  SpO2: 100 %   Intake/Output Summary (Last 24 hours) at 08/29/2023 0959 Last data filed at 08/29/2023 0436 Gross per 24 hour  Intake 331.31 ml  Output 300 ml  Net 31.31 ml   Filed Weights   08/27/23 1908  Weight: 55.8 kg    Exam: General exam: In no acute distress. Respiratory system: Good air movement and clear to auscultation. Cardiovascular system: S1 & S2 heard, RRR. No JVD. Gastrointestinal system: Abdomen is nondistended, soft and nontender.  Extremities: No pedal edema. Skin: No rashes, lesions or ulcers Psychiatry: Judgement and insight appear normal. Mood & affect appropriate.   Data Reviewed:    Labs: Basic Metabolic Panel: Recent Labs  Lab 08/26/23 0000 08/27/23 1853  08/28/23 0453 08/29/23 0321  NA 144 142 141 140  K 5.0 4.0 3.8 4.0  CL 106 107 111 108  CO2 22 21* 21* 22  GLUCOSE 89 114* 92 99  BUN 28* 39* 36* 28*  CREATININE 1.43* 1.27* 1.03 1.13  CALCIUM 9.3 9.3 8.8* 8.3*   GFR Estimated Creatinine Clearance: 37.7 mL/min (by C-G formula based on SCr of 1.13 mg/dL). Liver Function Tests: Recent Labs  Lab 08/27/23 1853  AST 23  ALT 14  ALKPHOS 59  BILITOT 0.6  PROT 6.5  ALBUMIN 3.6   No results for input(s): LIPASE, AMYLASE in the last 168 hours. No results for input(s): AMMONIA in the last 168 hours. Coagulation profile Recent Labs  Lab 08/27/23 1929  INR 1.1   COVID-19 Labs  No results for input(s): DDIMER, FERRITIN, LDH, CRP in the last 72 hours.  No results found for: SARSCOV2NAA  CBC: Recent Labs  Lab 08/27/23 1853 08/28/23 0453 08/29/23 0321  WBC 8.7 7.3 7.7  NEUTROABS 6.4  --   --   HGB 14.8 13.3 13.6  HCT 45.4 40.0 40.8  MCV 89.0 87.5 87.7  PLT 191 168 177   Cardiac Enzymes: No results for input(s): CKTOTAL, CKMB, CKMBINDEX, TROPONINI in the last 168 hours. BNP (last 3 results) No results for input(s): PROBNP in the last 8760 hours. CBG: No results for input(s): GLUCAP in the last 168 hours. D-Dimer: No results for input(s): DDIMER in the last 72 hours. Hgb A1c: No results for input(s): HGBA1C in the last 72 hours. Lipid Profile: No results for input(s): CHOL, HDL, LDLCALC, TRIG, CHOLHDL, LDLDIRECT in the last 72 hours. Thyroid  function studies: No results for input(s): TSH, T4TOTAL, T3FREE, THYROIDAB in the last 72 hours.  Invalid input(s): FREET3 Anemia work up: No results for input(s): VITAMINB12, FOLATE, FERRITIN, TIBC, IRON, RETICCTPCT in the last 72 hours. Sepsis Labs: Recent Labs  Lab 08/27/23 1853 08/28/23 0453 08/29/23 0321  WBC 8.7 7.3 7.7   Microbiology No results found for this or any previous visit (from the past  240 hours).   Medications:    fluticasone  furoate-vilanterol  1 puff Inhalation Daily   And   umeclidinium bromide   1 puff Inhalation Daily   pantoprazole   40 mg Oral BID   polyethylene glycol  17 g Oral BID   sodium chloride  flush  3 mL Intravenous Q12H   Continuous Infusions:  heparin  1,000 Units/hr (08/28/23 1946)      LOS: 2 days   Erle Odell Castor  [facility]  08/29/2023, 9:59 AM

## 2023-08-30 ENCOUNTER — Telehealth (HOSPITAL_COMMUNITY): Payer: Self-pay | Admitting: Pharmacy Technician

## 2023-08-30 ENCOUNTER — Ambulatory Visit: Payer: Medicare Other | Attending: Cardiology

## 2023-08-30 ENCOUNTER — Encounter: Payer: Self-pay | Admitting: Cardiology

## 2023-08-30 ENCOUNTER — Other Ambulatory Visit (HOSPITAL_COMMUNITY): Payer: Self-pay

## 2023-08-30 DIAGNOSIS — I493 Ventricular premature depolarization: Secondary | ICD-10-CM

## 2023-08-30 DIAGNOSIS — I2699 Other pulmonary embolism without acute cor pulmonale: Secondary | ICD-10-CM | POA: Diagnosis not present

## 2023-08-30 LAB — HEPARIN LEVEL (UNFRACTIONATED)
Heparin Unfractionated: 0.77 [IU]/mL — ABNORMAL HIGH (ref 0.30–0.70)
Heparin Unfractionated: 0.57 [IU]/mL (ref 0.30–0.70)

## 2023-08-30 LAB — CBC
HCT: 39.9 % (ref 39.0–52.0)
Hemoglobin: 13.1 g/dL (ref 13.0–17.0)
MCH: 28.9 pg (ref 26.0–34.0)
MCHC: 32.8 g/dL (ref 30.0–36.0)
MCV: 87.9 fL (ref 80.0–100.0)
Platelets: 163 10*3/uL (ref 150–400)
RBC: 4.54 MIL/uL (ref 4.22–5.81)
RDW: 14.3 % (ref 11.5–15.5)
WBC: 7.3 10*3/uL (ref 4.0–10.5)
nRBC: 0 % (ref 0.0–0.2)

## 2023-08-30 LAB — BASIC METABOLIC PANEL
Anion gap: 7 (ref 5–15)
BUN: 25 mg/dL — ABNORMAL HIGH (ref 8–23)
CO2: 21 mmol/L — ABNORMAL LOW (ref 22–32)
Calcium: 8.6 mg/dL — ABNORMAL LOW (ref 8.9–10.3)
Chloride: 111 mmol/L (ref 98–111)
Creatinine, Ser: 1.04 mg/dL (ref 0.61–1.24)
GFR, Estimated: >60 mL/min (ref >60)
Glucose, Bld: 98 mg/dL (ref 70–99)
Potassium: 4.3 mmol/L (ref 3.5–5.1)
Sodium: 139 mmol/L (ref 135–145)

## 2023-08-30 NOTE — Progress Notes (Signed)
 PROGRESS NOTE  David [name]  DOB: 07-07-1936  PCP: Towana Small, FNP FMW:[id number]  DOA: 08/27/2023  LOS: 3 days  Hospital Day: 4  Brief narrative: David [name] is a 88 y.o. male with PMH significant for Bell's palsy, COPD, nephrolithiasis 12/21, presented with shortness of breath, progressive lower extremity swelling, worsening over the last week. CT angio of chest showed extensive bilateral pulm embolism with right heart strain Started on heparin  drip Deemed not a candidate for thrombolysis Admitted to [facility] Ultrasound duplex lower extremity positive for acute DVT involving the left superficial femoral vein, femoral vein, popliteal vein, posterior tibial and peroneal veins  Subjective: Patient was seen and examined this a.m. Presented Eliquis  and male.  Sitting up in recliner.  Not in distress.  Daughter Charlies was at bedside. No pedal edema noted.  Does not look volume overloaded. Remains on heparin  drip Chart reviewed.  Hemodynamically stable Most recent labs from this morning mostly unremarkable  Assessment and plan: Acute bilateral PE  Acute left lower extremity DVT Presented with leg swelling and shortness of breath CT angio with PE, US  duplex with left LE DVT Currently on heparin  drip Plan to switch to Eliquis /Xarelto at the time of discharge   New onset CHF Echo 1/2 with EF low at 40 to 45%, no WMA, no LVH, grade 1 diastolic dysfunction PTA meds -bisoprolol , Lasix  Continue bisoprolol . Lasix  on hold. Cardiology consulted for potential need of cath   Frequent ectopy with bigeminal likely due to PE Continue bisoprolol  5 mg daily  AKI on CKD 3A Creatinine improved with hydration Recent Labs    09/18/22 1519 12/17/22 1944 08/26/23 0000 08/27/23 1853 08/28/23 0453 08/29/23 0321 08/30/23 0443  BUN 25* 29* 28* 39* 36* 28* 25*  CREATININE 0.95 1.12 1.43* 1.27* 1.03 1.13 1.04   COPD Continue bronchodilators   H/o gastritis PPI   Unintentional  weight loss No alarming symptoms will need to follow-up with PCP as an outpatient.   Mobility: Encourage ambulation  Goals of care   Code Status: Full Code     DVT prophylaxis: Heparin  drip    Antimicrobials: None Fluid: None Consultants: Cardiology Family Communication: Daughter at bedside  Status: Inpatient Level of care:  Progressive   Patient is from: Home Needs to continue in-hospital care: Pending cardiology eval Anticipated d/c to: Dependent cartilage input      Diet:  Diet Order             Diet Heart Room service appropriate? Yes with Assist; Fluid consistency: Thin  Diet effective now                   Scheduled Meds:  bisoprolol   5 mg Oral Daily   fluticasone  furoate-vilanterol  1 puff Inhalation Daily   And   umeclidinium bromide   1 puff Inhalation Daily   pantoprazole   40 mg Oral BID   sodium chloride  flush  3 mL Intravenous Q12H    PRN meds: acetaminophen  **OR** acetaminophen , ipratropium-albuterol , oxyCODONE , senna-docusate   Infusions:   heparin  950 Units/hr (08/30/23 0813)    Antimicrobials: Anti-infectives (From admission, onward)    None       Objective: Vitals:   08/30/23 0823 08/30/23 1214  BP: (!) 151/53 (!) 115/56  Pulse: (!) 55   Resp: 20 20  Temp:  98.5 F (36.9 C)  SpO2: 100%     Intake/Output Summary (Last 24 hours) at 08/30/2023 1333 Last data filed at 08/29/2023 2050 Gross per 24 hour  Intake 164.98  ml  Output --  Net 164.98 ml   Filed Weights   08/27/23 1908  Weight: 55.8 kg   Weight change:  Body mass index is 20.47 kg/m.   Physical Exam: General exam: Pleasant, elderly Caucasian male.  Not in distress Skin: No rashes, lesions or ulcers. HEENT: Atraumatic, normocephalic, no obvious bleeding Lungs: Clear to auscultation bilaterally,  CVS: Regular heart rhythm because of PVCs, no murmur GI/Abd: Soft, nontender, nondistended, bowel sound present,   CNS: Alert, awake, hard of hearing but  oriented x 3 Psychiatry: Mood appropriate,  Extremities: No pedal edema, no calf tenderness,   Data Review: I have personally reviewed the laboratory data and studies available.  F/u labs  Unresulted Labs (From admission, onward)     Start     Ordered   08/30/23 1600  Heparin  level (unfractionated)  Once-Timed,   TIMED        08/30/23 0728   08/29/23 0500  Heparin  level (unfractionated)  Daily,   R      08/27/23 2047   08/28/23 0500  Basic metabolic panel  Daily,   R      08/28/23 0029   08/28/23 0500  CBC  Daily,   R      08/28/23 0029           Total time spent in review of labs and imaging, patient evaluation, formulation of plan, documentation and communication with family: 55 minutes  Signed, Chapman Rota, MD [facility] 08/30/2023

## 2023-08-30 NOTE — Telephone Encounter (Signed)
 Patient Product/process development scientist completed.    The patient is insured through [facility]. Patient has Medicare and is not eligible for a copay card, but may be able to apply for patient assistance or Medicare RX Payment Plan (Patient Must reach out to their plan, if eligible for payment plan), if available.    Ran test claim for Eliquis 5 mg and the current 30 day co-pay is $420.00 due to a $375.00 deductible.  Will be $45.00 once deductible is met.  Ran test claim for Xarelto 20 mg and the current 30 day co-pay is $420.00 due to a $375.00 deductible.  Will be $45.00 once deductible is met.  This test claim was processed through [facility]- copay amounts may vary at other pharmacies due to pharmacy/plan contracts, or as the patient moves through the different stages of their insurance plan.     David [name], CPHT Pharmacy Technician III Certified Patient Advocate [facility] Patient Advocate Team Direct Number: [phone]  Fax: [phone]

## 2023-08-30 NOTE — Consult Note (Signed)
 Cardiology Consultation   Patient ID: David [name] MRN: [id number]; DOB: 08/15/1936  Admit date: 08/27/2023 Date of Consult: 08/30/2023  PCP:  Towana Small, FNP   [facility] Providers Cardiologist:  Shelda Bruckner, MD   {    Patient Profile:   David [name] is a 88 y.o. male with a hx of aortic atherosclerosis, RBBB, proximal ascending aorta dilatation per echocardiogram (08/27/2023) who is being seen 08/30/2023 for the evaluation of new onset congestive heart failure at the request of Dr. Arlice.  History of Present Illness:   Mr. Nicol is an 88yo male who has a history of aortic atherosclerosis, RBBB, proximal ascending aorta dilatation. He was being seen by Dr. Michele in the outpatient setting for a routine follow up. At this visit the patient complained of marked shortness of breath with conversation, at rest and with exertion as well as asymmetrical swelling of his lower extremities (left worse than right). He reported no known history of prior DVT or PE or any prolonged periods of immobilization. Denies any known family clotting disorders. Due to the rising concern for a DVT or PE Dr. Michele recommended the patient go to the ED for further evaluation and management.   The patient and his daughter arrived [name] the afternoon of 08/27/2023, coming straight from their outpatient cardiology appointment. In the ED the patient was found to be afebrile and oxygenating well on room air. He had mild tachypnea and stable BP. His EKG was completed and showed sinus rhythm with RBBB and frequent ectopy. Ultrasound was obtained and revealed acute DVT throughout his left leg. CTA chest revealed extensive bilateral PE with right heart strain. They determined there was no indication for thrombolytics or ICU admission. Patient was started on IV heparin  in the ED.   After visiting the patient and his daughter today, they agree with most of the history listed above.  The  patient has been up and walking around both in the hallway and ambulating around his room with only mild symptoms.  The daughter says that she notices his breathing change a little bit but reports that his oxygen had not dropped when he was walking in the hall and the patient does not feel symptomatic at exertion anymore.  The patient states that he no longer has any shortness of breath at rest or with exertion.  Denies any chest pain.  Patient states that the remaining edema of his left lower extremity is significantly improved from the past 48 hours.  The patient and his daughter are both aware of the plan to start a DOAC upon discharge.  They both state that they have been talked to about working up the potential cause of an unprovoked DVT, but they are not necessarily interested in going down that path at this time.  The patient's daughter adds that the Lasix  that was given to him recently due to the lower extremity swelling only provided some relief.  This make sense now assuming that the majority of the asymmetrical edema was secondary to a DVT.  Overall, patient feels significantly better since admission.  He understands the severity of his blood clots and understands the necessity of compliance with a DOAC upon discharge.  Patient has no obvious signs of bleeding at this time.  Denies any nosebleeds,  blood in the urine/stool, bleeding from his gums.  He denies any lightheadedness, dizziness, fatigue.    Past Medical History:  Diagnosis Date   Bell's palsy  COPD (chronic obstructive pulmonary disease) (HCC) 08/28/2023   Kidney stones    UTI (urinary tract infection)    Past Surgical History:  Procedure Laterality Date   TONSILLECTOMY      Home Medications:  Prior to Admission medications   Medication Sig Start Date End Date Taking? Authorizing Provider  bisoprolol  (ZEBETA ) 5 MG tablet Take 1 tablet (5 mg total) by mouth daily. 08/27/23  Yes Tolia, Sunit, DO  dextromethorphan (DELSYM) 30  MG/5ML liquid Take 15 mg by mouth as needed for cough.   Yes [provider]  Fluticasone -Umeclidin-Vilant (TRELEGY ELLIPTA ) 100-62.5-25 MCG/ACT AEPB Inhale 1 puff into the lungs daily. 08/27/23  Yes Towana Small, FNP  furosemide  (LASIX ) 40 MG tablet Take 1 tablet (40 mg total) by mouth daily for 3 days, then take 1 daily if needed  for swelling or fluid/weight gain of 2 pounds overnight or 5 pounds in a week. Patient taking differently: Take 40 mg by mouth daily. Take 1 tablet every day; if swelling increases in legs, take 2 tablets for 3 days and then resume normal dose 11/02/22  Yes Lonni Slain, MD  ibuprofen (ADVIL) 200 MG tablet Take 200 mg by mouth as needed for mild pain (pain score 1-3).   Yes [provider]  ipratropium-albuterol  (DUONEB) 0.5-2.5 (3) MG/3ML SOLN Inhale 3 mLs by nebulization every 4 (four) hours as needed. 08/26/23  Yes Towana Small, FNP  omeprazole  (PRILOSEC) 20 MG capsule Take 1 capsule (20 mg total) by mouth 2 (two) times daily before a meal. Patient taking differently: Take 20 mg by mouth as needed (heartburn and GERD). 09/24/22  Yes Early, Sara E, NP    Inpatient Medications: Scheduled Meds:  bisoprolol   5 mg Oral Daily   fluticasone  furoate-vilanterol  1 puff Inhalation Daily   And   umeclidinium bromide   1 puff Inhalation Daily   pantoprazole   40 mg Oral BID   sodium chloride  flush  3 mL Intravenous Q12H   Continuous Infusions:  heparin  950 Units/hr (08/30/23 0813)   PRN Meds: acetaminophen  **OR** acetaminophen , ipratropium-albuterol , oxyCODONE , senna-docusate  Allergies:   No Known Allergies  Social History:   Social History   Socioeconomic History   Marital status: Widowed    Spouse name: Not on file   Number of children: 2   Years of education: Not on file   Highest education level: Not on file  Occupational History   Occupation: retired  Tobacco Use   Smoking status: Never   Smokeless tobacco: Never   Vaping Use   Vaping status: Never Used  Substance and Sexual Activity   Alcohol use: No   Drug use: No   Sexual activity: Not on file  Other Topics Concern   Not on file  Social History Narrative   Not on file   Social Drivers of Health   Financial Resource Strain: Not on file  Food Insecurity: Patient Declined (08/29/2023)   Hunger Vital Sign    Worried About Running Out of Food in the Last Year: Patient declined    Ran Out of Food in the Last Year: Patient declined  Transportation Needs: Patient Declined (08/29/2023)   PRAPARE - Administrator, Civil Service (Medical): Patient declined    Lack of Transportation (Non-Medical): Patient declined  Physical Activity: Not on file  Stress: Not on file  Social Connections: Patient Declined (08/29/2023)   Social Connection and Isolation Panel [NHANES]    Frequency of Communication with Friends and Family: Patient declined  Frequency of Social Gatherings with Friends and Family: Patient declined    Attends Religious Services: Patient declined    Active Member of Clubs or Organizations: Patient declined    Attends Banker Meetings: Patient declined    Marital Status: Patient declined  Intimate Partner Violence: Patient Declined (08/29/2023)   Humiliation, Afraid, Rape, and Kick questionnaire    Fear of Current or Ex-Partner: Patient declined    Emotionally Abused: Patient declined    Physically Abused: Patient declined    Sexually Abused: Patient declined    Family History:   Family History  Problem Relation Age of Onset   Heart disease Brother    Liver disease Neg Hx    Esophageal cancer Neg Hx    Colon cancer Neg Hx     ROS:  Please see the history of present illness.  All other ROS reviewed and negative.     Physical Exam/Data:   Vitals:   08/29/23 2006 08/30/23 0656 08/30/23 0823 08/30/23 1214  BP: (!) 142/71 127/73 (!) 151/53 (!) 115/56  Pulse: (!) 56  (!) 55   Resp: 18 18 20 20   Temp: 97.7 F  (36.5 C) (!) 97.5 F (36.4 C)  98.5 F (36.9 C)  TempSrc: Oral Axillary  Oral  SpO2: 95% 95% 100%   Weight:      Height:        Intake/Output Summary (Last 24 hours) at 08/30/2023 1539 Last data filed at 08/29/2023 2050 Gross per 24 hour  Intake 164.98 ml  Output --  Net 164.98 ml      08/27/2023    7:08 PM 08/27/2023    2:43 PM 08/26/2023    2:18 PM  Last 3 Weights  Weight (lbs) 123 lb 123 lb 9.6 oz 122 lb 12.8 oz  Weight (kg) 55.792 kg 56.065 kg 55.702 kg     Body mass index is 20.47 kg/m.  General:  In no acute distress HEENT: normal Neck: no JVD Vascular: Distal pulses 2+ bilaterally Cardiac:  normal S1, S2; RRR; no murmur, frequent PVCs Lungs:  clear to auscultation bilaterally, no wheezing, rhonchi or rales  Abd: soft, nontender  Ext: mild edema in LLE, patient states significantly improved from baseline Musculoskeletal:  No deformities, BUE and BLE strength normal and equal Skin: warm and dry  Neuro:  no focal abnormalities noted Psych:  Normal affect   EKG:  The EKG was personally reviewed and demonstrates:  sinus rhythm, frequent PVCs, RBBB HR 88  Relevant CV Studies: Echocardiogram (08/29/2023) IMPRESSIONS   1. Frequent PVCs during study.   2. Left ventricular ejection fraction, by estimation, is 40 to 45%. The  left ventricle has mildly decreased function. The left ventricle has no  regional wall motion abnormalities. Left ventricular diastolic parameters  are consistent with Grade I  diastolic dysfunction (impaired relaxation).   3. Right ventricular systolic function is normal. The right ventricular  size is mildly enlarged. There is normal pulmonary artery systolic  pressure.   4. The mitral valve is normal in structure. Trivial mitral valve  regurgitation. No evidence of mitral stenosis.   5. The aortic valve is normal in structure. There is mild calcification  of the aortic valve. Aortic valve regurgitation is mild. No aortic  stenosis is present.    6. The inferior vena cava is normal in size with greater than 50%  respiratory variability, suggesting right atrial pressure of 3 mmHg.   FINDINGS   Left Ventricle: Left ventricular ejection fraction, by estimation,  is 40  to 45%. The left ventricle has mildly decreased function. The left  ventricle has no regional wall motion abnormalities. The left ventricular  internal cavity size was normal in  size. There is no left ventricular hypertrophy. Left ventricular diastolic  parameters are consistent with Grade I diastolic dysfunction (impaired  relaxation).   Right Ventricle: The right ventricular size is mildly enlarged. No  increase in right ventricular wall thickness. Right ventricular systolic  function is normal. There is normal pulmonary artery systolic pressure.  The tricuspid regurgitant velocity is 1.99   m/s, and with an assumed right atrial pressure of 8 mmHg, the estimated  right ventricular systolic pressure is 23.8 mmHg.   Left Atrium: Left atrial size was normal in size.   Right Atrium: Right atrial size was normal in size.   Pericardium: There is no evidence of pericardial effusion.   Mitral Valve: The mitral valve is normal in structure. Trivial mitral  valve regurgitation. No evidence of mitral valve stenosis.   Tricuspid Valve: The tricuspid valve is normal in structure. Tricuspid  valve regurgitation is trivial. No evidence of tricuspid stenosis.   Aortic Valve: The aortic valve is normal in structure. There is mild  calcification of the aortic valve. There is mild aortic valve annular  calcification. Aortic valve regurgitation is mild. Aortic regurgitation  PHT measures 585 msec. No aortic stenosis  is present. Aortic valve mean gradient measures 2.0 mmHg. Aortic valve  peak gradient measures 3.5 mmHg. Aortic valve area, by VTI measures 3.25  cm.   Pulmonic Valve: The pulmonic valve was normal in structure. Pulmonic valve  regurgitation is not  visualized. No evidence of pulmonic stenosis.   Aorta: The aortic root is normal in size and structure.   Venous: The inferior vena cava is normal in size with greater than 50%  respiratory variability, suggesting right atrial pressure of 3 mmHg.   IAS/Shunts: No atrial level shunt detected by color flow Doppler.   CTA Chest (08/27/2023) FINDINGS: Cardiovascular: Thoracic aorta demonstrates atherosclerotic calcifications without aneurysmal dilatation. The pulmonary artery demonstrates extensive bilateral pulmonary emboli with evidence of right heart strain within RV/LV ratio of 1.0. Coronary calcifications are noted. Cardiac enlargement is noted.   Mediastinum/Nodes: Thoracic inlet is within normal limits. No hilar or mediastinal adenopathy is noted. The esophagus as visualized is within normal limits.   Lungs/Pleura: Lungs are well aerated bilaterally. Mild emphysematous changes are noted. No focal infiltrate or sizable effusion is seen.   Upper Abdomen: Visualized upper abdomen is within normal limits.   Musculoskeletal: Degenerative changes of the thoracic spine are noted. Chronic compression deformity at T5 is noted.   Review of the MIP images confirms the above findings.   IMPRESSION: Extensive bilateral pulmonary embolism with evidence of right heart strain as described above.   No other focal acute abnormality is noted.  Venous LE U/S (08/27/2023) RIGHT:  - No evidence of common femoral vein obstruction.    LEFT:  - Findings consistent with acute deep vein thrombosis involving the SF  junction, left femoral vein, left popliteal vein, left posterior tibial  veins, and left peroneal veins.  - Findings consistent with acute superficial vein thrombosis involving the  left great saphenous vein, and left small saphenous vein.  Findings consistent with acute intramuscular thrombosis involving the left  gastrocnemius veins.  - No cystic structure found in the popliteal  fossa.   Laboratory Data:  High Sensitivity Troponin:   Recent Labs  Lab 08/27/23 1853 08/27/23 2044  TROPONINIHS 22* 26*     Chemistry Recent Labs  Lab 08/28/23 0453 08/29/23 0321 08/30/23 0443  NA 141 140 139  K 3.8 4.0 4.3  CL 111 108 111  CO2 21* 22 21*  GLUCOSE 92 99 98  BUN 36* 28* 25*  CREATININE 1.03 1.13 1.04  CALCIUM 8.8* 8.3* 8.6*  GFRNONAA >60 >60 >60  ANIONGAP 9 10 7     Recent Labs  Lab 08/27/23 1853  PROT 6.5  ALBUMIN 3.6  AST 23  ALT 14  ALKPHOS 59  BILITOT 0.6   Lipids No results for input(s): CHOL, TRIG, HDL, LABVLDL, LDLCALC, CHOLHDL in the last 168 hours.  Hematology Recent Labs  Lab 08/28/23 0453 08/29/23 0321 08/30/23 0443  WBC 7.3 7.7 7.3  RBC 4.57 4.65 4.54  HGB 13.3 13.6 13.1  HCT 40.0 40.8 39.9  MCV 87.5 87.7 87.9  MCH 29.1 29.2 28.9  MCHC 33.3 33.3 32.8  RDW 14.1 14.2 14.3  PLT 168 177 163   Thyroid  No results for input(s): TSH, FREET4 in the last 168 hours.  BNP Recent Labs  Lab 08/26/23 0000  BNP 85.7    DDimer No results for input(s): DDIMER in the last 168 hours.  Radiology/Studies:  ECHOCARDIOGRAM COMPLETE Result Date: 08/29/2023    ECHOCARDIOGRAM REPORT   Patient Name:   MELVEN STOCKARD Date of Exam: 08/29/2023 Medical Rec #:  [id number]       Height:       65.0 in Accession #:    [id number]      Weight:       123.0 lb Date of Birth:  1936-05-13       BSA:          1.609 m Patient Age:    87 years        BP:           126/102 mmHg Patient Gender: M               HR:           46 bpm. Exam Location:  Inpatient Procedure: 2D Echo, Cardiac Doppler and Color Doppler Indications:    Pulmonary Embolus  History:        Patient has prior history of Echocardiogram examinations, most                 recent 10/31/2022. COPD; Arrythmias:Bradycardia and PVC.  Sonographer:    Ozell Free Referring Phys: [id number] TIMOTHY S OPYD IMPRESSIONS  1. Frequent PVCs during study.  2. Left ventricular ejection fraction, by  estimation, is 40 to 45%. The left ventricle has mildly decreased function. The left ventricle has no regional wall motion abnormalities. Left ventricular diastolic parameters are consistent with Grade I diastolic dysfunction (impaired relaxation).  3. Right ventricular systolic function is normal. The right ventricular size is mildly enlarged. There is normal pulmonary artery systolic pressure.  4. The mitral valve is normal in structure. Trivial mitral valve regurgitation. No evidence of mitral stenosis.  5. The aortic valve is normal in structure. There is mild calcification of the aortic valve. Aortic valve regurgitation is mild. No aortic stenosis is present.  6. The inferior vena cava is normal in size with greater than 50% respiratory variability, suggesting right atrial pressure of 3 mmHg. FINDINGS  Left Ventricle: Left ventricular ejection fraction, by estimation, is 40 to 45%. The left ventricle has mildly decreased function. The left ventricle has no regional wall motion abnormalities. The left ventricular internal cavity size  was normal in size. There is no left ventricular hypertrophy. Left ventricular diastolic parameters are consistent with Grade I diastolic dysfunction (impaired relaxation). Right Ventricle: The right ventricular size is mildly enlarged. No increase in right ventricular wall thickness. Right ventricular systolic function is normal. There is normal pulmonary artery systolic pressure. The tricuspid regurgitant velocity is 1.99  m/s, and with an assumed right atrial pressure of 8 mmHg, the estimated right ventricular systolic pressure is 23.8 mmHg. Left Atrium: Left atrial size was normal in size. Right Atrium: Right atrial size was normal in size. Pericardium: There is no evidence of pericardial effusion. Mitral Valve: The mitral valve is normal in structure. Trivial mitral valve regurgitation. No evidence of mitral valve stenosis. Tricuspid Valve: The tricuspid valve is normal in  structure. Tricuspid valve regurgitation is trivial. No evidence of tricuspid stenosis. Aortic Valve: The aortic valve is normal in structure. There is mild calcification of the aortic valve. There is mild aortic valve annular calcification. Aortic valve regurgitation is mild. Aortic regurgitation PHT measures 585 msec. No aortic stenosis is present. Aortic valve mean gradient measures 2.0 mmHg. Aortic valve peak gradient measures 3.5 mmHg. Aortic valve area, by VTI measures 3.25 cm. Pulmonic Valve: The pulmonic valve was normal in structure. Pulmonic valve regurgitation is not visualized. No evidence of pulmonic stenosis. Aorta: The aortic root is normal in size and structure. Venous: The inferior vena cava is normal in size with greater than 50% respiratory variability, suggesting right atrial pressure of 3 mmHg. IAS/Shunts: No atrial level shunt detected by color flow Doppler.  LEFT VENTRICLE PLAX 2D LVIDd:         5.40 cm      Diastology LVIDs:         4.50 cm      LV e' medial:    5.44 cm/s LV PW:         0.90 cm      LV E/e' medial:  10.1 LV IVS:        1.00 cm      LV e' lateral:   7.15 cm/s LVOT diam:     2.10 cm      LV E/e' lateral: 7.7 LV SV:         59 LV SV Index:   37 LVOT Area:     3.46 cm  LV Volumes (MOD) LV vol d, MOD A2C: 103.0 ml LV vol d, MOD A4C: 152.0 ml LV vol s, MOD A2C: 69.0 ml LV vol s, MOD A4C: 80.8 ml LV SV MOD A2C:     34.0 ml LV SV MOD A4C:     152.0 ml LV SV MOD BP:      56.9 ml RIGHT VENTRICLE             IVC RV Basal diam:  5.10 cm     IVC diam: 2.60 cm RV S prime:     12.10 cm/s TAPSE (M-mode): 2.7 cm LEFT ATRIUM             Index        RIGHT ATRIUM           Index LA diam:        4.20 cm 2.61 cm/m   RA Area:     21.60 cm LA Vol (A2C):   68.0 ml 42.27 ml/m  RA Volume:   57.00 ml  35.43 ml/m LA Vol (A4C):   41.8 ml 25.98 ml/m LA Biplane Vol: 53.6 ml 33.31 ml/m  AORTIC VALVE AV Area (Vmax):    3.30 cm AV Area (Vmean):   3.24 cm AV Area (VTI):     3.25 cm AV Vmax:            93.20 cm/s AV Vmean:          59.500 cm/s AV VTI:            0.181 m AV Peak Grad:      3.5 mmHg AV Mean Grad:      2.0 mmHg LVOT Vmax:         88.90 cm/s LVOT Vmean:        55.700 cm/s LVOT VTI:          0.170 m LVOT/AV VTI ratio: 0.94 AI PHT:            585 msec  AORTA Ao Root diam: 3.60 cm Ao Asc diam:  3.60 cm MITRAL VALVE               TRICUSPID VALVE MV Area (PHT): 4.52 cm    TR Peak grad:   15.8 mmHg MV Decel Time: 168 msec    TR Vmax:        199.00 cm/s MV E velocity: 55.10 cm/s MV A velocity: 80.80 cm/s  SHUNTS MV E/A ratio:  0.68        Systemic VTI:  0.17 m                            Systemic Diam: 2.10 cm Aditya Sabharwal Electronically signed by Ria Commander Signature Date/Time: 08/29/2023/10:17:54 AM    Final    VAS US  LOWER EXTREMITY VENOUS (DVT) (ONLY [facility] & [facility]) Result Date: 08/28/2023  Lower Venous DVT Study Patient Name:  David [name]  Date of Exam:   08/27/2023 Medical Rec #: [id number]        Accession #:    [id number] Date of Birth: [birth year]        Patient Gender: M Patient Age:   39 years Exam Location:  [facility] Procedure:      VAS US  LOWER EXTREMITY VENOUS (DVT) Referring Phys: LYLE SMALL --------------------------------------------------------------------------------  Indications: Swelling, and SOB.  Comparison Study: No previous exams Performing Technologist: Jody Hill RVT, RDMS  Examination Guidelines: A complete evaluation includes B-mode imaging, spectral Doppler, color Doppler, and power Doppler as needed of all accessible portions of each vessel. Bilateral testing is considered an integral part of a complete examination. Limited examinations for reoccurring indications may be performed as noted. The reflux portion of the exam is performed with the patient in reverse Trendelenburg.  +-----+---------------+---------+-----------+----------+--------------+ RIGHTCompressibilityPhasicitySpontaneityPropertiesThrombus Aging  +-----+---------------+---------+-----------+----------+--------------+ CFV  Full           Yes      Yes                                 +-----+---------------+---------+-----------+----------+--------------+   +---------+---------------+---------+-----------+----------+--------------+ LEFT     CompressibilityPhasicitySpontaneityPropertiesThrombus Aging +---------+---------------+---------+-----------+----------+--------------+ CFV      Full           Yes      Yes                                 +---------+---------------+---------+-----------+----------+--------------+ SFJ      Partial  Acute          +---------+---------------+---------+-----------+----------+--------------+ FV Prox  Full           Yes      Yes                                 +---------+---------------+---------+-----------+----------+--------------+ FV Mid   Full           Yes      Yes                                 +---------+---------------+---------+-----------+----------+--------------+ FV DistalNone           No       No                   Acute          +---------+---------------+---------+-----------+----------+--------------+ PFV      Full                                                        +---------+---------------+---------+-----------+----------+--------------+ POP      None           No       No                   Acute          +---------+---------------+---------+-----------+----------+--------------+ PTV      None           No       No                   Acute          +---------+---------------+---------+-----------+----------+--------------+ PERO     None           No       No                   Acute          +---------+---------------+---------+-----------+----------+--------------+ Gastroc  None           No       No                                   +---------+---------------+---------+-----------+----------+--------------+ GSV      Partial        Yes      Yes                  Acute          +---------+---------------+---------+-----------+----------+--------------+ SSV      None           No       No                   Acute          +---------+---------------+---------+-----------+----------+--------------+    Summary: RIGHT: - No evidence of common femoral vein obstruction.   LEFT: - Findings consistent with acute deep vein thrombosis involving the SF junction, left femoral vein, left popliteal vein, left posterior tibial veins, and left peroneal veins. - Findings consistent with acute superficial  vein thrombosis involving the left great saphenous vein, and left small saphenous vein. Findings consistent with acute intramuscular thrombosis involving the left gastrocnemius veins. - No cystic structure found in the popliteal fossa.  *See table(s) above for measurements and observations. Electronically signed by Penne Colorado MD on 08/28/2023 at 10:31:02 AM.    Final    CT Angio Chest PE W and/or Wo Contrast Result Date: 08/27/2023 CLINICAL DATA:  Leg pain and shortness of breath, initial encounter EXAM: CT ANGIOGRAPHY CHEST WITH CONTRAST TECHNIQUE: Multidetector CT imaging of the chest was performed using the standard protocol during bolus administration of intravenous contrast. Multiplanar CT image reconstructions and MIPs were obtained to evaluate the vascular anatomy. RADIATION DOSE REDUCTION: This exam was performed according to the departmental dose-optimization program which includes automated exposure control, adjustment of the mA and/or kV according to patient size and/or use of iterative reconstruction technique. CONTRAST:  75mL OMNIPAQUE  IOHEXOL  350 MG/ML SOLN COMPARISON:  Chest x-ray from the previous day. FINDINGS: Cardiovascular: Thoracic aorta demonstrates atherosclerotic calcifications without aneurysmal dilatation. The pulmonary  artery demonstrates extensive bilateral pulmonary emboli with evidence of right heart strain within RV/LV ratio of 1.0. Coronary calcifications are noted. Cardiac enlargement is noted. Mediastinum/Nodes: Thoracic inlet is within normal limits. No hilar or mediastinal adenopathy is noted. The esophagus as visualized is within normal limits. Lungs/Pleura: Lungs are well aerated bilaterally. Mild emphysematous changes are noted. No focal infiltrate or sizable effusion is seen. Upper Abdomen: Visualized upper abdomen is within normal limits. Musculoskeletal: Degenerative changes of the thoracic spine are noted. Chronic compression deformity at T5 is noted. Review of the MIP images confirms the above findings. IMPRESSION: Extensive bilateral pulmonary embolism with evidence of right heart strain as described above. No other focal acute abnormality is noted. Aortic Atherosclerosis (ICD10-I70.0) and Emphysema (ICD10-J43.9). Critical Value/emergent results were called by telephone at the time of interpretation on 08/27/2023 at 8:20 pm to Dr. Dean , who verbally acknowledged these results. Electronically Signed   By: Oneil Devonshire M.D.   On: 08/27/2023 20:30   DG Chest 2 View Result Date: 08/26/2023 CLINICAL DATA:  Ongoing shortness of breath. Dyspnea on exertion. Bilateral leg edema. EXAM: CHEST - 2 VIEW COMPARISON:  12/17/2022 FINDINGS: Normal heart size. Aortic atherosclerotic calcifications. Mediastinal contours appear normal. Lungs are hyperinflated with chronic coarsened interstitial markings bilaterally. No superimposed pleural effusion, interstitial edema or airspace disease. No acute osseous findings. IMPRESSION: 1. No acute cardiopulmonary disease. 2. Chronic hyperinflation and coarsened interstitial markings suggestive of COPD. Electronically Signed   By: Waddell Calk M.D.   On: 08/26/2023 18:08   Assessment and Plan:   Acute bilateral pulmonary embolism Acute left lower extremity DVT Patient  presented to the ED, after being sent by Dr. Michele, to get evaluated for possible DVT/PE with symptoms of severe shortness of breath even at rest and asymmetrical left LE edema  Doppler U/S showed left lower extremity DVT CTA chest showed extensive bilateral pulmonary embolism with evidence of right heart strain It was determined patient was not a candidate for thrombolytics Patient was started on a heparin  drip per pharmacy dosing  Patient denies any obvious signs of bleeding at this time.  No reported nosebleeds, blood in the urine/stool, bleeding from the gums.  Patient denies any fatigue, lightheadedness, dizziness. -- Plan to switch to DOAC at time of discharge   New onset heart failure likely secondary to bilateral PE Echo from 10/2022 showed: LVEF 55-60%, no regional wall abnormalities, no LVH, normal RV size and function,  moderately elevated pulmonary artery systolic pressure, moderately dilated LA, mild to moderate MR, trivial TR, mild calcification of aortic valve, mild to moderate AR, trivial PR, mild dilation of ascending aorta, dilated IVC Echo from 08/29/2023 showed: LVEF 40-45%, no regional wall motion abnormalities, LV diastolic parameters are consistent with Grade I diastolic dysfunction, mildly enlarged RV, RV function normal, normal pulmonary artery systolic pressure, normal size of left and right atria, trivial MR, trivial TR, mild calcification of aortic valve, mild AR, normal size and structure of aortic root, normal IVC size CTA chest showed evidence of right heart strain  Patient has experienced some bradycardia, low 50s.  Per patient and previous office visits it seems as though his baseline tends to be a bit lower.  Patient is not symptomatic at this time. -- Patients acute drop in LVEF likely secondary to his extensive bilateral PEs  -- Patient on PTA bisoprolol  5 mg daily  -- Consider follow up echocardiogram in a couple months, given patient remains asymptomatic, to determine  if drop in EF was secondary to PE or ongoing issue that needs to be medically managed  Frequent ectopy on EKG   Patient was supposed to get an outpatient Zio monitor to monitor PAC/PVC burden. This was mentioned at the office visit on 12/31 where he came directly to the hospital afterwards -- Continue bisoprolol  5 mg daily -- Patient to follow up outpatient to discuss moving forward with Zio monitoring in the future  Per primary AKI on CKD IIIa COPD History of gastritis Unintentional weight loss   Risk Assessment/Risk Scores:      [place]  Heart Association (NYHA) Functional Class NYHA Class IV   For questions or updates, please contact [facility] Please consult [email or web address] for contact info under   Signed, Waddell DELENA Donath, PA-C  08/30/2023 3:39 PM

## 2023-08-30 NOTE — Progress Notes (Signed)
 PHARMACY - ANTICOAGULATION CONSULT NOTE  Pharmacy Consult for heparin   Indication: pulmonary embolus and DVT  No Known Allergies  Patient Measurements: Height: 5' 5 (165.1 cm) Weight: 55.8 kg (123 lb) IBW/kg (Calculated) : 61.5 Heparin  Dosing Weight: 55.8 kg  Vital Signs: Temp: 97.7 F (36.5 C) (01/03 1621) Temp Source: Oral (01/03 1621) BP: 141/62 (01/03 1621) Pulse Rate: 55 (01/03 0823)  Labs: Recent Labs    08/27/23 1853 08/27/23 1929 08/27/23 2044 08/28/23 0453 08/28/23 1351 08/29/23 0321 08/30/23 0443 08/30/23 1539  HGB 14.8  --   --  13.3  --  13.6 13.1  --   HCT 45.4  --   --  40.0  --  40.8 39.9  --   PLT 191  --   --  168  --  177 163  --   LABPROT  --  14.4  --   --   --   --   --   --   INR  --  1.1  --   --   --   --   --   --   HEPARINUNFRC  --   --   --  0.50   < > 0.55 0.77* 0.57  CREATININE 1.27*  --   --  1.03  --  1.13 1.04  --   TROPONINIHS 22*  --  26*  --   --   --   --   --    < > = values in this interval not displayed.    Estimated Creatinine Clearance: 41 mL/min (by C-G formula based on SCr of 1.04 mg/dL).   Medical History: Past Medical History:  Diagnosis Date   Bell's palsy    COPD (chronic obstructive pulmonary disease) (HCC) 08/28/2023   Kidney stones    UTI (urinary tract infection)    Assessment: 88 yo M with PE/DVT. Pharmacy consulted for heparin . No anticoagulants PTA.  Conversion to DOAC at discharge.   2nd update: Heparin  level therapeutic at 0.57. No issues with bleeding reported. No new H&H.    Goal of Therapy:  Heparin  level 0.3-0.7 units/ml Monitor platelets by anticoagulation protocol: Yes   Plan:  Continue heparin  @ 950 units/hr 8-heparin  confirmatory level  Check CBC and heparin  level daily Monitor for signs/symptoms of bleeding  Rankin Sams, PharmD, BCPS, BCCCP Clinical Pharmacist

## 2023-08-30 NOTE — Progress Notes (Addendum)
 PHARMACY - ANTICOAGULATION CONSULT NOTE  Pharmacy Consult for heparin   Indication: pulmonary embolus and DVT  No Known Allergies  Patient Measurements: Height: 5' 5 (165.1 cm) Weight: 55.8 kg (123 lb) IBW/kg (Calculated) : 61.5 Heparin  Dosing Weight: 55.8 kg  Vital Signs: Temp: 97.5 F (36.4 C) (01/03 0656) Temp Source: Axillary (01/03 0656) BP: 127/73 (01/03 0656) Pulse Rate: 56 (01/02 2006)  Labs: Recent Labs    08/27/23 1853 08/27/23 1929 08/27/23 2044 08/28/23 0453 08/28/23 1351 08/28/23 2226 08/29/23 0321 08/30/23 0443  HGB 14.8  --   --  13.3  --   --  13.6 13.1  HCT 45.4  --   --  40.0  --   --  40.8 39.9  PLT 191  --   --  168  --   --  177 163  LABPROT  --  14.4  --   --   --   --   --   --   INR  --  1.1  --   --   --   --   --   --   HEPARINUNFRC  --   --   --  0.50   < > 0.44 0.55 0.77*  CREATININE 1.27*  --   --  1.03  --   --  1.13 1.04  TROPONINIHS 22*  --  26*  --   --   --   --   --    < > = values in this interval not displayed.    Estimated Creatinine Clearance: 41 mL/min (by C-G formula based on SCr of 1.04 mg/dL).   Medical History: Past Medical History:  Diagnosis Date   Bell's palsy    COPD (chronic obstructive pulmonary disease) (HCC) 08/28/2023   Kidney stones    UTI (urinary tract infection)    Assessment: 88 yo M with PE/DVT. Pharmacy consulted for heparin . No anticoagulants PTA.  Today, heparin  level supratherapeutic at 0.77 No issues with heparin  infusion or signs/symptoms of bleeding noted. CBC stable - hgb 13.1 and plts 163.   Possible conversion to DOAC today. Will contact [facility] for DOAC copay information.   Goal of Therapy:  Heparin  level 0.3-0.7 units/ml Monitor platelets by anticoagulation protocol: Yes   Plan:  Decrease heparin  to 950 units/hr 8-heparin  level following dose increase  Check CBC and heparin  level daily Monitor for signs/symptoms of bleeding F/u possible change to DOAC today    Redell Nazir A. Lyle, PharmD, BCPS, FNKF Clinical Pharmacist [place] Please utilize Amion for appropriate phone number to reach the unit pharmacist [facility])  08/30/2023 7:24 AM

## 2023-08-30 NOTE — Plan of Care (Signed)

## 2023-08-31 ENCOUNTER — Other Ambulatory Visit (HOSPITAL_BASED_OUTPATIENT_CLINIC_OR_DEPARTMENT_OTHER): Payer: Self-pay

## 2023-08-31 ENCOUNTER — Other Ambulatory Visit (HOSPITAL_COMMUNITY): Payer: Self-pay

## 2023-08-31 DIAGNOSIS — I2699 Other pulmonary embolism without acute cor pulmonale: Secondary | ICD-10-CM | POA: Diagnosis not present

## 2023-08-31 LAB — CBC
HCT: 41.5 % (ref 39.0–52.0)
Hemoglobin: 13.5 g/dL (ref 13.0–17.0)
MCH: 29 pg (ref 26.0–34.0)
MCHC: 32.5 g/dL (ref 30.0–36.0)
MCV: 89.1 fL (ref 80.0–100.0)
Platelets: 177 10*3/uL (ref 150–400)
RBC: 4.66 MIL/uL (ref 4.22–5.81)
RDW: 14.4 % (ref 11.5–15.5)
WBC: 7.2 10*3/uL (ref 4.0–10.5)
nRBC: 0 % (ref 0.0–0.2)

## 2023-08-31 LAB — BASIC METABOLIC PANEL
Anion gap: 6 (ref 5–15)
BUN: 29 mg/dL — ABNORMAL HIGH (ref 8–23)
CO2: 24 mmol/L (ref 22–32)
Calcium: 8.7 mg/dL — ABNORMAL LOW (ref 8.9–10.3)
Chloride: 109 mmol/L (ref 98–111)
Creatinine, Ser: 1.19 mg/dL (ref 0.61–1.24)
GFR, Estimated: 60 mL/min — ABNORMAL LOW (ref >60)
Glucose, Bld: 101 mg/dL — ABNORMAL HIGH (ref 70–99)
Potassium: 4.6 mmol/L (ref 3.5–5.1)
Sodium: 139 mmol/L (ref 135–145)

## 2023-08-31 LAB — HEPARIN LEVEL (UNFRACTIONATED): Heparin Unfractionated: 0.69 [IU]/mL (ref 0.30–0.70)

## 2023-08-31 MED ORDER — APIXABAN 5 MG PO TABS
10.0000 mg | ORAL_TABLET | Freq: Two times a day (BID) | ORAL | Status: DC
  Administered 2023-08-31: 10 mg via ORAL
  Filled 2023-08-31: qty 2

## 2023-08-31 MED ORDER — BISOPROLOL FUMARATE 5 MG PO TABS
5.0000 mg | ORAL_TABLET | Freq: Every day | ORAL | 0 refills | Status: DC
  Filled 2023-08-31 (×2): qty 30, 30d supply, fill #0

## 2023-08-31 MED ORDER — APIXABAN (ELIQUIS) VTE STARTER PACK (10MG AND 5MG)
ORAL_TABLET | ORAL | 0 refills | Status: DC
  Filled 2023-08-31: qty 74, 30d supply, fill #0

## 2023-08-31 MED ORDER — FUROSEMIDE 40 MG PO TABS: 40.0000 mg | ORAL_TABLET | Freq: Every day | ORAL | Status: DC | PRN

## 2023-08-31 MED ORDER — APIXABAN 5 MG PO TABS: 5.0000 mg | ORAL_TABLET | Freq: Two times a day (BID) | ORAL | Status: DC

## 2023-08-31 NOTE — Plan of Care (Signed)
   Problem: Clinical Measurements: Goal: Respiratory complications will improve Outcome: Progressing Goal: Cardiovascular complication will be avoided Outcome: Progressing   Problem: Activity: Goal: Risk for activity intolerance will decrease Outcome: Progressing

## 2023-08-31 NOTE — Discharge Summary (Signed)
 Physician Discharge Summary  KENYAN KARNES FMW:[id number] DOB: Nov 29, 1935 DOA: 08/27/2023  PCP: Towana Small, FNP  Admit date: 08/27/2023 Discharge date: 08/31/2023  Admitted From: Home Discharge disposition: Home  Recommendations at discharge:  You have been started on a blood thinner.  Please watch out for any obvious bleeding, black stool or easy bruisability.  Please contact your primary care provider for further recommendation. Follow-up with cardiology as an outpatient.    Brief narrative: David [name] is a 88 y.o. male with PMH significant for Bell's palsy, COPD, nephrolithiasis 12/21, presented with shortness of breath, progressive lower extremity swelling, worsening over the last week. CT angio of chest showed extensive bilateral pulm embolism with right heart strain Started on heparin  drip Deemed not a candidate for thrombolysis Admitted to [facility] Ultrasound duplex lower extremity positive for acute DVT involving the left superficial femoral vein, femoral vein, popliteal vein, posterior tibial and peroneal veins  Subjective: Patient was seen and examined this a.m. Propped up in bed.  Not in distress.  Daughter at bedside. Ready for discharge to home today.  Hospital course: Acute bilateral PE  Acute left lower extremity DVT Presented with leg swelling and shortness of breath CT angio with PE, US  duplex with left LE DVT He was started on heparin  drip. I discussed with patient and his daughter at bedside about anticoagulation options. This morning, I switched him to Eliquis .  Continue for at least 6 months.  Need outpatient workup for longer indication.   New onset CHF Echo 1/2 with EF low at 40 to 45%, no WMA, no LVH, grade 1 diastolic dysfunction Prior echo from March 2024 had shown EF of 55 to 60%. Cardiology was consulted given drop in EF.  Per cardiology, it is likely due to acute PE.  Clinically euvolemic.  No further workup needed at this time PTA  meds -bisoprolol , Lasix  Continue bisoprolol . Lasix  can resume as as needed. Follow-up with cardiology as an outpatient.   Frequent ectopy with bigeminal likely due to PE Continue bisoprolol  5 mg daily  AKI on CKD 3A Creatinine improved with hydration Recent Labs    09/18/22 1519 12/17/22 1944 08/26/23 0000 08/27/23 1853 08/28/23 0453 08/29/23 0321 08/30/23 0443 08/31/23 0554  BUN 25* 29* 28* 39* 36* 28* 25* 29*  CREATININE 0.95 1.12 1.43* 1.27* 1.03 1.13 1.04 1.19   COPD Continue bronchodilators   H/o gastritis PPI   Unintentional weight loss No alarming symptoms will need to follow-up with PCP as an outpatient.   Goals of care   Code Status: Full Code   Diet:  Diet Order             Diet general           Diet Heart Room service appropriate? Yes with Assist; Fluid consistency: Thin  Diet effective now                   Nutritional status:  Body mass index is 20.47 kg/m.       Wounds:  -    Discharge Exam:   Vitals:   08/30/23 1621 08/30/23 2002 08/31/23 0554 08/31/23 0812  BP: (!) 141/62 130/68 119/65 138/68  Pulse:   (!) 51 68  Resp: 20  16 16   Temp: 97.7 F (36.5 C) 98.3 F (36.8 C) (!) 97.4 F (36.3 C) 97.6 F (36.4 C)  TempSrc: Oral Oral Oral Axillary  SpO2:   100% 95%  Weight:      Height:  Body mass index is 20.47 kg/m.   General exam: Pleasant, elderly Caucasian male.  Not in distress Skin: No rashes, lesions or ulcers. HEENT: Atraumatic, normocephalic, no obvious bleeding Lungs: Clear to auscultation bilaterally,  CVS: Regular heart rhythm because of PVCs, no murmur GI/Abd: Soft, nontender, nondistended, bowel sound present,   CNS: Alert, awake, hard of hearing but oriented x 3 Psychiatry: Mood appropriate,  Extremities: No pedal edema, no calf tenderness,   Follow ups:    Follow-up Information     Towana Small, FNP Follow up.   Specialty: Family Medicine Contact information: [address] [place] [address] [phone]                 Discharge Instructions:   Discharge Instructions     Call MD for:  difficulty breathing, headache or visual disturbances   Complete by: As directed    Call MD for:  extreme fatigue   Complete by: As directed    Call MD for:  hives   Complete by: As directed    Call MD for:  persistant dizziness or light-headedness   Complete by: As directed    Call MD for:  persistant nausea and vomiting   Complete by: As directed    Call MD for:  severe uncontrolled pain   Complete by: As directed    Call MD for:  temperature >100.4   Complete by: As directed    Diet general   Complete by: As directed    Discharge instructions   Complete by: As directed    Recommendations at discharge:   You have been started on a blood thinner.  Please watch out for any obvious bleeding, black stool or easy bruisability.  Please contact your primary care provider for further recommendation.  Follow-up with cardiology as an outpatient.  General discharge instructions: Follow with Primary MD Towana Small, FNP in 7 days  Please request your PCP  to go over your hospital tests, procedures, radiology results at the follow up. Please get your medicines reviewed and adjusted.  Your PCP may decide to repeat certain labs or tests as needed. Do not drive, operate heavy machinery, perform activities at heights, swimming or participation in water activities or provide baby sitting services if your were admitted for syncope or siezures until you have seen by Primary MD or a Neurologist and advised to do so again. [place]  Controlled Substance Reporting System database was reviewed. Do not drive, operate heavy machinery, perform activities at heights, swim, participate in water activities or provide baby-sitting services while on medications for pain, sleep and mood until your outpatient physician has reevaluated you and advised to do so again.   You are strongly recommended to comply with the dose, frequency and duration of prescribed medications. Activity: As tolerated with Full fall precautions use walker/cane & assistance as needed Avoid using any recreational substances like cigarette, tobacco, alcohol, or non-prescribed drug. If you experience worsening of your admission symptoms, develop shortness of breath, life threatening emergency, suicidal or homicidal thoughts you must seek medical attention immediately by calling 911 or calling your MD immediately  if symptoms less severe. You must read complete instructions/literature along with all the possible adverse reactions/side effects for all the medicines you take and that have been prescribed to you. Take any new medicine only after you have completely understood and accepted all the possible adverse reactions/side effects.  Wear Seat belts while driving. You were cared for by a hospitalist during your  hospital stay. If you have any questions about your discharge medications or the care you received while you were in the hospital after you are discharged, you can call the unit and ask to speak with the hospitalist or the covering physician. Once you are discharged, your primary care physician will handle any further medical issues. Please note that NO REFILLS for any discharge medications will be authorized once you are discharged, as it is imperative that you return to your primary care physician (or establish a relationship with a primary care physician if you do not have one).   Discharge instructions for CHF Check weight daily -preferably same time every day. Restrict fluid intake to 1200 ml daily Restrict salt intake to less than 2 g daily. Call MD if you have one of the following symptoms 1) 3 pound weight gain in 24 hours or 5 pounds in 1 week  2) swelling in the hands, feet or stomach  3) progressive shortness of breath 4) if you have to sleep on extra pillows at night in  order to breathe        You have been started on a blood thinner.  Please watch out for any obvious bleeding, black stool or easy bruisability.  Please contact your primary care provider for further recommendation.   Increase activity slowly   Complete by: As directed        Discharge Medications:   Allergies as of 08/31/2023   No Known Allergies      Medication List     TAKE these medications    Apixaban  Starter Pack (10mg  and 5mg ) Commonly known as: ELIQUIS  STARTER PACK Take as directed on package: start with two-5mg  tablets twice daily for 7 days. On day 8, switch to one-5mg  tablet twice daily.   bisoprolol  5 MG tablet Commonly known as: ZEBETA  Take 1 tablet (5 mg total) by mouth daily.   dextromethorphan 30 MG/5ML liquid Commonly known as: DELSYM Take 15 mg by mouth as needed for cough.   furosemide  40 MG tablet Commonly known as: LASIX  Take 1 tablet (40 mg total) by mouth daily as needed. Take 1 tablet every day; if swelling increases in legs, take 2 tablets for 3 days and then resume normal dose What changed:  when to take this reasons to take this additional instructions   ibuprofen 200 MG tablet Commonly known as: ADVIL Take 200 mg by mouth as needed for mild pain (pain score 1-3).   ipratropium-albuterol  0.5-2.5 (3) MG/3ML Soln Commonly known as: DUONEB Inhale 3 mLs by nebulization every 4 (four) hours as needed.   omeprazole  20 MG capsule Commonly known as: PRILOSEC Take 1 capsule (20 mg total) by mouth 2 (two) times daily before a meal. What changed:  when to take this reasons to take this   Trelegy Ellipta  100-62.5-25 MCG/ACT Aepb Generic drug: Fluticasone -Umeclidin-Vilant Inhale 1 puff into the lungs daily.         The results of significant diagnostics from this hospitalization (including imaging, microbiology, ancillary and laboratory) are listed below for reference.    Procedures and Diagnostic Studies:   VAS US  LOWER EXTREMITY  VENOUS (DVT) (ONLY [facility] & [facility]) Result Date: 08/28/2023  Lower Venous DVT Study Patient Name:  CAPERS HAGMANN  Date of Exam:   08/27/2023 Medical Rec #: [id number]        Accession #:    [id number] Date of Birth: 1935-10-06        Patient Gender: M Patient Age:   88  years Exam Location:  [facility] Procedure:      VAS US  LOWER EXTREMITY VENOUS (DVT) Referring Phys: BROOKE SMALL --------------------------------------------------------------------------------  Indications: Swelling, and SOB.  Comparison Study: No previous exams Performing Technologist: Jody Hill RVT, RDMS  Examination Guidelines: A complete evaluation includes B-mode imaging, spectral Doppler, color Doppler, and power Doppler as needed of all accessible portions of each vessel. Bilateral testing is considered an integral part of a complete examination. Limited examinations for reoccurring indications may be performed as noted. The reflux portion of the exam is performed with the patient in reverse Trendelenburg.  +-----+---------------+---------+-----------+----------+--------------+ RIGHTCompressibilityPhasicitySpontaneityPropertiesThrombus Aging +-----+---------------+---------+-----------+----------+--------------+ CFV  Full           Yes      Yes                                 +-----+---------------+---------+-----------+----------+--------------+   +---------+---------------+---------+-----------+----------+--------------+ LEFT     CompressibilityPhasicitySpontaneityPropertiesThrombus Aging +---------+---------------+---------+-----------+----------+--------------+ CFV      Full           Yes      Yes                                 +---------+---------------+---------+-----------+----------+--------------+ SFJ      Partial                                      Acute          +---------+---------------+---------+-----------+----------+--------------+ FV Prox  Full           Yes      Yes                                  +---------+---------------+---------+-----------+----------+--------------+ FV Mid   Full           Yes      Yes                                 +---------+---------------+---------+-----------+----------+--------------+ FV DistalNone           No       No                   Acute          +---------+---------------+---------+-----------+----------+--------------+ PFV      Full                                                        +---------+---------------+---------+-----------+----------+--------------+ POP      None           No       No                   Acute          +---------+---------------+---------+-----------+----------+--------------+ PTV      None           No       No  Acute          +---------+---------------+---------+-----------+----------+--------------+ PERO     None           No       No                   Acute          +---------+---------------+---------+-----------+----------+--------------+ Gastroc  None           No       No                                  +---------+---------------+---------+-----------+----------+--------------+ GSV      Partial        Yes      Yes                  Acute          +---------+---------------+---------+-----------+----------+--------------+ SSV      None           No       No                   Acute          +---------+---------------+---------+-----------+----------+--------------+    Summary: RIGHT: - No evidence of common femoral vein obstruction.   LEFT: - Findings consistent with acute deep vein thrombosis involving the SF junction, left femoral vein, left popliteal vein, left posterior tibial veins, and left peroneal veins. - Findings consistent with acute superficial vein thrombosis involving the left great saphenous vein, and left small saphenous vein. Findings consistent with acute intramuscular thrombosis involving the left gastrocnemius veins. - No  cystic structure found in the popliteal fossa.  *See table(s) above for measurements and observations. Electronically signed by Penne Colorado MD on 08/28/2023 at 10:31:02 AM.    Final    CT Angio Chest PE W and/or Wo Contrast Result Date: 08/27/2023 CLINICAL DATA:  Leg pain and shortness of breath, initial encounter EXAM: CT ANGIOGRAPHY CHEST WITH CONTRAST TECHNIQUE: Multidetector CT imaging of the chest was performed using the standard protocol during bolus administration of intravenous contrast. Multiplanar CT image reconstructions and MIPs were obtained to evaluate the vascular anatomy. RADIATION DOSE REDUCTION: This exam was performed according to the departmental dose-optimization program which includes automated exposure control, adjustment of the mA and/or kV according to patient size and/or use of iterative reconstruction technique. CONTRAST:  75mL OMNIPAQUE  IOHEXOL  350 MG/ML SOLN COMPARISON:  Chest x-ray from the previous day. FINDINGS: Cardiovascular: Thoracic aorta demonstrates atherosclerotic calcifications without aneurysmal dilatation. The pulmonary artery demonstrates extensive bilateral pulmonary emboli with evidence of right heart strain within RV/LV ratio of 1.0. Coronary calcifications are noted. Cardiac enlargement is noted. Mediastinum/Nodes: Thoracic inlet is within normal limits. No hilar or mediastinal adenopathy is noted. The esophagus as visualized is within normal limits. Lungs/Pleura: Lungs are well aerated bilaterally. Mild emphysematous changes are noted. No focal infiltrate or sizable effusion is seen. Upper Abdomen: Visualized upper abdomen is within normal limits. Musculoskeletal: Degenerative changes of the thoracic spine are noted. Chronic compression deformity at T5 is noted. Review of the MIP images confirms the above findings. IMPRESSION: Extensive bilateral pulmonary embolism with evidence of right heart strain as described above. No other focal acute abnormality is noted.  Aortic Atherosclerosis (ICD10-I70.0) and Emphysema (ICD10-J43.9). Critical Value/emergent results were called by telephone at the time of interpretation on 08/27/2023 at 8:20 pm to Dr. Dean ,  who verbally acknowledged these results. Electronically Signed   By: Oneil Devonshire M.D.   On: 08/27/2023 20:30     Labs:   Basic Metabolic Panel: Recent Labs  Lab 08/27/23 1853 08/28/23 0453 08/29/23 0321 08/30/23 0443 08/31/23 0554  NA 142 141 140 139 139  K 4.0 3.8 4.0 4.3 4.6  CL 107 111 108 111 109  CO2 21* 21* 22 21* 24  GLUCOSE 114* 92 99 98 101*  BUN 39* 36* 28* 25* 29*  CREATININE 1.27* 1.03 1.13 1.04 1.19  CALCIUM 9.3 8.8* 8.3* 8.6* 8.7*   GFR Estimated Creatinine Clearance: 35.8 mL/min (by C-G formula based on SCr of 1.19 mg/dL). Liver Function Tests: Recent Labs  Lab 08/27/23 1853  AST 23  ALT 14  ALKPHOS 59  BILITOT 0.6  PROT 6.5  ALBUMIN 3.6   No results for input(s): LIPASE, AMYLASE in the last 168 hours. No results for input(s): AMMONIA in the last 168 hours. Coagulation profile Recent Labs  Lab 08/27/23 1929  INR 1.1    CBC: Recent Labs  Lab 08/27/23 1853 08/28/23 0453 08/29/23 0321 08/30/23 0443 08/31/23 0554  WBC 8.7 7.3 7.7 7.3 7.2  NEUTROABS 6.4  --   --   --   --   HGB 14.8 13.3 13.6 13.1 13.5  HCT 45.4 40.0 40.8 39.9 41.5  MCV 89.0 87.5 87.7 87.9 89.1  PLT 191 168 177 163 177   Cardiac Enzymes: No results for input(s): CKTOTAL, CKMB, CKMBINDEX, TROPONINI in the last 168 hours. BNP: Invalid input(s): POCBNP CBG: No results for input(s): GLUCAP in the last 168 hours. D-Dimer No results for input(s): DDIMER in the last 72 hours. Hgb A1c No results for input(s): HGBA1C in the last 72 hours. Lipid Profile No results for input(s): CHOL, HDL, LDLCALC, TRIG, CHOLHDL, LDLDIRECT in the last 72 hours. Thyroid  function studies No results for input(s): TSH, T4TOTAL, T3FREE, THYROIDAB in the last 72  hours.  Invalid input(s): FREET3 Anemia work up No results for input(s): VITAMINB12, FOLATE, FERRITIN, TIBC, IRON, RETICCTPCT in the last 72 hours. Microbiology No results found for this or any previous visit (from the past 240 hours).  Time coordinating discharge: 45 minutes  Signed: Sayuri Rhames  [facility] 08/31/2023, 10:12 AM

## 2023-08-31 NOTE — Progress Notes (Signed)
 PHARMACY - ANTICOAGULATION CONSULT NOTE  Pharmacy Consult for heparin   Indication: pulmonary embolus and DVT  No Known Allergies  Patient Measurements: Height: 5' 5 (165.1 cm) Weight: 55.8 kg (123 lb) IBW/kg (Calculated) : 61.5 Heparin  Dosing Weight: 55.8 kg  Vital Signs: Temp: 97.4 F (36.3 C) (01/04 0554) Temp Source: Oral (01/04 0554) BP: 119/65 (01/04 0554) Pulse Rate: 51 (01/04 0554)  Labs: Recent Labs    08/29/23 0321 08/30/23 0443 08/30/23 1539 08/31/23 0554  HGB 13.6 13.1  --  13.5  HCT 40.8 39.9  --  41.5  PLT 177 163  --  177  HEPARINUNFRC 0.55 0.77* 0.57 0.69  CREATININE 1.13 1.04  --  1.19    Estimated Creatinine Clearance: 35.8 mL/min (by C-G formula based on SCr of 1.19 mg/dL).   Medical History: Past Medical History:  Diagnosis Date   Bell's palsy    COPD (chronic obstructive pulmonary disease) (HCC) 08/28/2023   Kidney stones    UTI (urinary tract infection)    Assessment: 88 yo M with PE/DVT. Pharmacy consulted for heparin . No anticoagulants PTA.  Conversion to DOAC at discharge.   This morning, heparin  level therapeutic at 0.69, however, on higher end of goal. No issues with bleeding reported. CBC stable - hgb 13.5 and plts 177.    Goal of Therapy:  Heparin  level 0.3-0.7 units/ml Monitor platelets by anticoagulation protocol: Yes   Plan:  Decrease heparin  to 900 units/hr 8-heparin  confirmatory level  Check CBC and heparin  level daily Monitor for signs/symptoms of bleeding  Lum Laine, PharmD PGY1 Pharmacy Resident 08/31/2023 8:07 AM

## 2023-09-02 LAB — CBC WITH DIFFERENTIAL/PLATELET
Basophils Absolute: 0.1 10*3/uL (ref 0.0–0.2)
Basos: 1 %
EOS (ABSOLUTE): 0.4 10*3/uL (ref 0.0–0.4)
Eos: 4 %
Hematocrit: 45.1 % (ref 37.5–51.0)
Hemoglobin: 14.6 g/dL (ref 13.0–17.7)
Immature Grans (Abs): 0 10*3/uL (ref 0.0–0.1)
Immature Granulocytes: 0 %
Lymphocytes Absolute: 1.3 10*3/uL (ref 0.7–3.1)
Lymphs: 14 %
MCH: 28.6 pg (ref 26.6–33.0)
MCHC: 32.4 g/dL (ref 31.5–35.7)
MCV: 88 fL (ref 79–97)
Monocytes Absolute: 0.6 10*3/uL (ref 0.1–0.9)
Monocytes: 6 %
Neutrophils Absolute: 6.6 10*3/uL (ref 1.4–7.0)
Neutrophils: 75 %
Platelets: 193 10*3/uL (ref 150–450)
RBC: 5.1 x10E6/uL (ref 4.14–5.80)
RDW: 13 % (ref 11.6–15.4)
WBC: 8.9 10*3/uL (ref 3.4–10.8)

## 2023-09-02 LAB — BASIC METABOLIC PANEL

## 2023-09-02 LAB — BRAIN NATRIURETIC PEPTIDE

## 2023-09-02 LAB — TSH RFX ON ABNORMAL TO FREE T4: TSH: 3.95 u[IU]/mL (ref 0.450–4.500)

## 2023-09-03 ENCOUNTER — Telehealth: Payer: Self-pay | Admitting: *Deleted

## 2023-09-03 NOTE — Telephone Encounter (Signed)
 A 3 day ZIO patch monitor has been ordered for your dad.  I was calling to see if you would want that mailed to his residence, or if you would want to schedule an appointment to have the monitor applied.  Please call Kaden Daughdrill in monitors at [phone].

## 2023-09-09 ENCOUNTER — Other Ambulatory Visit (HOSPITAL_BASED_OUTPATIENT_CLINIC_OR_DEPARTMENT_OTHER): Payer: Self-pay

## 2023-09-12 NOTE — Progress Notes (Signed)
David [name], male    DOB: Dec 26, 1935    MRN: [id number]   Brief patient profile:  25  yowm never regular  referred to pulmonary clinic in [place]  09/13/2023 by Arby Barrette  for doe    Admit date: 08/27/2023 Discharge date: 08/31/2023   Admitted From: Home Discharge disposition: Home   Recommendations at discharge:  You have been started on a blood thinner.  Please watch out for any obvious bleeding, black stool or easy bruisability.  Please contact your primary care provider for further recommendation. Follow-up with cardiology as an outpatient.       Brief narrative: David [name] is a 88 y.o. male with PMH significant for Bell's palsy, COPD, nephrolithiasis 12/21, presented with shortness of breath, progressive lower extremity swelling, worsening over the last week. CT angio of chest showed extensive bilateral pulm embolism with right heart strain Started on heparin drip Deemed not a candidate for thrombolysis Admitted to [facility] Ultrasound duplex lower extremity positive for acute DVT involving the left superficial femoral vein, femoral vein, popliteal vein, posterior tibial and peroneal veins    Hospital course: Acute bilateral PE  Acute left lower extremity DVT Presented with leg swelling and shortness of breath CT angio with PE, US duplex with left LE DVT He was started on heparin drip. I discussed with patient and his daughter at bedside about anticoagulation options. This morning, I switched him to Eliquis.  Continue for at least 6 months.  Need outpatient workup for longer indication.     New onset CHF Echo 1/2 with EF low at 40 to 45%, no WMA, no LVH, grade 1 diastolic dysfunction Prior echo from March 2024 had shown EF of 55 to 60%. Cardiology was consulted given drop in EF.  Per cardiology, it is likely due to acute PE.  Clinically euvolemic.  No further workup needed at this time PTA meds -bisoprolol, Lasix Continue bisoprolol. Lasix can resume as  as needed. Follow-up with cardiology as an outpatient.   Frequent ectopy with bigeminal likely due to PE Continue bisoprolol 5 mg daily   AKI on CKD 3A Creatinine improved with hydration Recent Labs (within last 365 days)            Recent Labs    09/18/22 1519 12/17/22 1944 08/26/23 0000 08/27/23 1853 08/28/23 0453 08/29/23 0321 08/30/23 0443 08/31/23 0554  BUN 25* 29* 28* 39* 36* 28* 25* 29*  CREATININE 0.95 1.12 1.43* 1.27* 1.03 1.13 1.04 1.19      COPD Continue bronchodilators    H/o gastritis PPI   Unintentional weight loss No alarming symptoms will need to follow-up with PCP as an outpatient.   Goals of care   Code Status: Full Code    History of Present Illness  09/13/2023  Pulmonary/ 1st office eval/ [facility] / [place] [facility]  No chief complaint on file. Dyspnea:  mb and back is 50 ft  but short of breath walking around house / did steps fine at discharge and shoveled snow fine  Cough: none  Sleep: lie flat ok  SABA use: none  02: none     No obvious day to day or daytime pattern/variability or assoc excess/ purulent sputum or mucus plugs or hemoptysis or cp or chest tightness, subjective wheeze or overt sinus or hb symptoms.    Also denies any obvious fluctuation of symptoms with weather or environmental changes or other aggravating or alleviating factors except as outlined above   No unusual exposure hx or  h/o childhood pna/ asthma or knowledge of premature birth.  Current Allergies, Complete Past Medical History, Past Surgical History, Family History, and Social History were reviewed in [employer] record.  ROS  The following are not active complaints unless bolded Hoarseness, sore throat, dysphagia, dental problems, itching, sneezing,  nasal congestion or discharge of excess mucus or purulent secretions, ear ache,   fever, chills, sweats, unintended wt loss or wt gain, classically pleuritic or exertional cp,  orthopnea pnd or  arm/hand swelling  or leg swelling, presyncope, palpitations, abdominal pain, anorexia, nausea, vomiting, diarrhea  or change in bowel habits or change in bladder habits, change in stools or change in urine, dysuria, hematuria,  rash, arthralgias, visual complaints, headache, numbness, weakness or ataxia or problems with walking or coordination,  change in mood or  memory.            Outpatient Medications Prior to Visit  Medication Sig Dispense Refill   APIXABAN (ELIQUIS) VTE STARTER PACK (10MG  AND 5MG ) Take as directed on package: start with two-5mg  tablets twice daily for 7 days. On day 8, switch to one-5mg  tablet twice daily. 74 each 0   bisoprolol (ZEBETA) 5 MG tablet Take 1 tablet (5 mg total) by mouth daily. 30 tablet 0   omeprazole (PRILOSEC) 20 MG capsule Take 1 capsule (20 mg total) by mouth 2 (two) times daily before a meal. (Patient taking differently: Take 20 mg by mouth as needed (heartburn and GERD).) 180 capsule 1   dextromethorphan (DELSYM) 30 MG/5ML liquid Take 15 mg by mouth as needed for cough.     Fluticasone-Umeclidin-Vilant (TRELEGY ELLIPTA) 100-62.5-25 MCG/ACT AEPB Inhale 1 puff into the lungs daily. 60 each 5   furosemide (LASIX) 40 MG tablet Take 1 tablet (40 mg total) by mouth daily as needed. Take 1 tablet every day; if swelling increases in legs, take 2 tablets for 3 days and then resume normal dose     ibuprofen (ADVIL) 200 MG tablet Take 200 mg by mouth as needed for mild pain (pain score 1-3).     ipratropium-albuterol (DUONEB) 0.5-2.5 (3) MG/3ML SOLN Inhale 3 mLs by nebulization every 4 (four) hours as needed. 90 mL 1   No facility-administered medications prior to visit.    Past Medical History:  Diagnosis Date   Bell's palsy    COPD (chronic obstructive pulmonary disease) (HCC) 08/28/2023   Kidney stones    UTI (urinary tract infection)       Objective:     BP (!) 165/75   Pulse (!) 45   Wt 128 lb 12.8 oz (58.4 kg)   SpO2 96%   BMI 21.43 kg/m    SpO2: 96 % RA  Depressed amb wm nad / slow moving     HEENT : Oropharynx  clear      Nasal turbinates nl    NECK :  without  apparent JVD/ palpable Nodes/TM    LUNGS: no acc muscle use,  Nl contour chest which is clear to A and P bilaterally without cough on insp or exp maneuvers   CV:  RRR  no s3 or murmur - NO increase in P2, and  1+ pitting LLE / none on R   ABD:  soft and nontender   MS:  Gait slow slt unsteady  ext warm without deformities Or obvious joint restrictions  calf tenderness, cyanosis or clubbing    SKIN: warm and dry without lesions    NEURO:  alert, approp, nl sensorium with  no motor or cerebellar deficits apparent.      I personally reviewed images and agree with radiology impression as follows:  Chest CTa   08/27/23 Extensive bilateral pulmonary embolism with evidence of right heart strain    No other focal acute abnormality is noted. Aortic Atherosclerosis (ICD10-I70.0) and Emphysema (ICD10-J43.9).     Assessment   Acute pulmonary embolism  with L DVT Onset ? But dx made 08/27/23 with extensive L DVT by venous dopplers  -  RV strain on CTa 08/27/23 with Echo 08/29/23  ef 40% / lots of pvc's/ no elevation PAS or RV abnormalities and walked halls/ did steps prior to d/c on Eliquis  - 09/13/2023 reports sob across the room at home since d/c but mb and back ok since d/c though LLE swelling gradually improving  - 09/13/2023   Walked on RA   x  2  lap(s) =  approx 300  ft  @ mod pace, stopped due to end of study  with lowest 02 sats 95% (erratic pulse made this TDS)   >>> He did not have increased or decreased RV function 2 days after starting anticoagulation and L leg swelling down but not resolved so I suspect some of his clots were chronic and may take longer to dissolve  - if not better to his satisfaction at 3 m may do v/q but CTa not that helpful in chronic PE.   >>> needs repeat venous dopplers to document resolution before considering stopping DOAC at  6 months though in unproked setting may consider dropping to prophylactic dose at that point vs stopping completely based on risk/benefit at that point.   >>> For now needs cbc to r/o anemia and  bmet plus any labs deemed necessary by cards when he sees them this am.   >>> When cleared by cardiology I would like him to start building back up ex tol by walking up to 30 min daily monitoring sats at peak ex and call if losing ground but expecting a gradual improvement over next few weeks as his body reabsorbs the clots.  Each maintenance medication was reviewed in detail including emphasizing most importantly the difference between maintenance and prns and under what circumstances the prns are to be triggered using an action plan format where appropriate.  Total time for H and P, chart review, counseling,  directly observing portions of ambulatory 02 saturation study/ and generating customized AVS unique to this office visit / same day charting = 60 min new pt eval                 Sandrea Hughs, MD 09/13/2023

## 2023-09-13 ENCOUNTER — Ambulatory Visit: Payer: Medicare Other | Admitting: Internal Medicine

## 2023-09-13 ENCOUNTER — Encounter (HOSPITAL_COMMUNITY): Payer: Self-pay

## 2023-09-13 ENCOUNTER — Ambulatory Visit: Payer: Medicare Other | Attending: Cardiology | Admitting: Cardiology

## 2023-09-13 ENCOUNTER — Encounter: Payer: Self-pay | Admitting: Cardiology

## 2023-09-13 ENCOUNTER — Telehealth (HOSPITAL_COMMUNITY): Payer: Self-pay | Admitting: *Deleted

## 2023-09-13 ENCOUNTER — Other Ambulatory Visit (HOSPITAL_COMMUNITY): Payer: Self-pay

## 2023-09-13 ENCOUNTER — Encounter: Payer: Self-pay | Admitting: Internal Medicine

## 2023-09-13 ENCOUNTER — Telehealth: Payer: Self-pay

## 2023-09-13 VITALS — BP 165/75 | HR 45 | Wt 128.8 lb

## 2023-09-13 VITALS — BP 130/60 | HR 48 | Resp 16 | Ht 65.0 in | Wt 131.2 lb

## 2023-09-13 DIAGNOSIS — R0609 Other forms of dyspnea: Secondary | ICD-10-CM

## 2023-09-13 DIAGNOSIS — I493 Ventricular premature depolarization: Secondary | ICD-10-CM | POA: Diagnosis not present

## 2023-09-13 DIAGNOSIS — I82412 Acute embolism and thrombosis of left femoral vein: Secondary | ICD-10-CM | POA: Diagnosis not present

## 2023-09-13 DIAGNOSIS — R072 Precordial pain: Secondary | ICD-10-CM | POA: Diagnosis not present

## 2023-09-13 DIAGNOSIS — I2699 Other pulmonary embolism without acute cor pulmonale: Secondary | ICD-10-CM

## 2023-09-13 DIAGNOSIS — I4510 Unspecified right bundle-branch block: Secondary | ICD-10-CM

## 2023-09-13 DIAGNOSIS — I700 Atherosclerosis of aorta: Secondary | ICD-10-CM

## 2023-09-13 MED ORDER — FUROSEMIDE 20 MG PO TABS: 20.0000 mg | ORAL_TABLET | Freq: Every day | ORAL | Status: DC

## 2023-09-13 MED ORDER — METOPROLOL TARTRATE 25 MG PO TABS
25.0000 mg | ORAL_TABLET | Freq: Two times a day (BID) | ORAL | 3 refills | Status: DC
  Filled 2023-09-13: qty 180, 90d supply, fill #0
  Filled 2023-11-28: qty 180, 90d supply, fill #1

## 2023-09-13 NOTE — Telephone Encounter (Signed)
Spoke with pt's daughter Luster Landsberg < per DPR. Who asked that instructions for stress test be sent to my chart. My chart instructions sent.

## 2023-09-13 NOTE — Assessment & Plan Note (Addendum)
Onset ? But dx made 08/27/23 with extensive L DVT by venous dopplers  -  RV strain on CTa 08/27/23 with Echo 08/29/23  ef 40% / lots of pvc's/ no elevation PAS or RV abnormalities and walked halls/ did steps prior to d/c on Eliquis  - 09/13/2023 reports sob across the room at home since d/c but mb and back ok since d/c though LLE swelling gradually improving  - 09/13/2023   Walked on RA   x  2  lap(s) =  approx 300  ft  @ mod pace, stopped due to end of study  with lowest 02 sats 95% (erratic pulse made this TDS)   >>> He did not have increased or decreased RV function 2 days after starting anticoagulation and L leg swelling down but not resolved so I suspect some of his clots were chronic and may take longer to dissolve  - if not better to his satisfaction at 3 m may do v/q but CTa not that helpful in chronic PE.   >>> needs repeat venous dopplers to document resolution before considering stopping DOAC at 6 months though in unproked setting may consider dropping to prophylactic dose at that point vs stopping completely based on risk/benefit at that point.   >>> For now needs cbc to r/o anemia and  bmet plus any labs deemed necessary by cards when he sees them this am.   >>> When cleared by cardiology I would like him to start building back up ex tol by walking up to 30 min daily monitoring sats at peak ex and call if losing ground but expecting a gradual improvement over next few weeks as his body reabsorbs the clots.  Each maintenance medication was reviewed in detail including emphasizing most importantly the difference between maintenance and prns and under what circumstances the prns are to be triggered using an action plan format where appropriate.  Total time for H and P, chart review, counseling,  directly observing portions of ambulatory 02 saturation study/ and generating customized AVS unique to this office visit / same day charting = 60 min new pt eval

## 2023-09-13 NOTE — Progress Notes (Signed)
Cardiology Office Note:  .   Date:  09/13/2023  ID:  David [name], DOB 01-23-1936, MRN [id number] PCP:  Hilbert Bible, FNP  [facility] Providers Cardiologist:  Tessa Lerner, DO  Electrophysiologist:  None  Click to update primary MD,subspecialty MD or APP then REFRESH:1}    Chief Complaint  Patient presents with   Hospitalization Follow-up   Dyspnea on exertion    History of Present Illness: .   David [name] is a 88 y.o. Caucasian male whose past medical history and cardiovascular risk factors includes: bilateral extensive pulmonary embolism (August 27, 2023), DVT (left lower extremity 08/27/2023) aortic atherosclerosis, right bundle branch block, proximal ascending aorta dilatation per echocardiogram, advanced age.  Patient is accompanied by his daughter Luster Landsberg at today's office visit and she also provides collateral history during this encounter.    Patient was last seen in the office on August 27, 2023 due to concerns for worsening shortness of breath and tenderness in the bilateral lower extremities I was concerned that he may have pulmonary embolism/DVT as he did not complain of symptoms of heart failure or COPD exacerbation.  Patient was asked to go to the ER for further evaluation.  Patient was hospitalized from 08/27/2023 through August 31, 2023 and was diagnosed with acute bilateral PE and acute DVT.   During hospitalization he had an echocardiogram which noted mildly reduced LVEF of 40-45% with no regional wall motion abnormalities.  Cardiology was consulted and it was felt that his reduction in LVEF could have been secondary to acute PE.  Since hospital discharge shortness of breath is improved.  But he also endorses precordial chest heaviness.  He describes the discomfort as difficulty breathing.  No substernal discomfort, no pain with effort related activities, does not improve with resting.  Did follow-up with pulmonary medicine earlier this morning.   Tentative plan is to be on anticoagulation for 6 months given his acute DVT and PE.   Review of Systems: .   Review of Systems  Cardiovascular:  Positive for chest pain (See HPI), dyspnea on exertion and leg swelling. Negative for claudication, irregular heartbeat, near-syncope, orthopnea, palpitations, paroxysmal nocturnal dyspnea and syncope.  Respiratory:  Positive for shortness of breath.   Hematologic/Lymphatic: Negative for bleeding problem.    Studies Reviewed:   EKG: EKG Interpretation Date/Time:  Friday September 13 2023 11:11:30 EST Ventricular Rate:  52 PR Interval:  180 QRS Duration:  130 QT Interval:  474 QTC Calculation: 440 R Axis:   72  Text Interpretation: Sinus bradycardia with occasional Premature ventricular complexes Right bundle branch block When compared with ECG of 27-Aug-2023 20:35, HEART RATE DECREASED SINCE Since last tracing Confirmed by Tessa Lerner [phone]) on 09/13/2023 11:21:09 AM  Echocardiogram: March 2024: LVEF 55 to 60%, indeterminate diastolic pattern, moderate LAE, moderate MR, mild to moderate AR, estimated RAP 8 mmHg, frequent PVCs noted  January 2025: LVEF 40-45%, grade 1 diastolic dysfunction, right ventricular size is mildly enlarged, RV systolic function is normal, trivial MR, mild AR, estimated RAP 3 mmHg.  RADIOLOGY: Chest xray:  08/26/2023 1. No acute cardiopulmonary disease. 2. Chronic hyperinflation and coarsened interstitial markings suggestive of COPD.   Risk Assessment/Calculations:   NA  Labs:       Latest Ref Rng & Units 08/31/2023    5:54 AM 08/30/2023    4:43 AM 08/29/2023    3:21 AM  CBC  WBC 4.0 - 10.5 K/uL 7.2  7.3  7.7   Hemoglobin 13.0 - 17.0  g/dL 16.1  09.6  04.5   Hematocrit 39.0 - 52.0 % 41.5  39.9  40.8   Platelets 150 - 400 K/uL 177  163  177        Latest Ref Rng & Units 08/31/2023    5:54 AM 08/30/2023    4:43 AM 08/29/2023    3:21 AM  BMP  Glucose 70 - 99 mg/dL 409  98  99   BUN 8 - 23 mg/dL 29  25  28     Creatinine 0.61 - 1.24 mg/dL 8.11  9.14  7.82   Sodium 135 - 145 mmol/L 139  139  140   Potassium 3.5 - 5.1 mmol/L 4.6  4.3  4.0   Chloride 98 - 111 mmol/L 109  111  108   CO2 22 - 32 mmol/L 24  21  22    Calcium 8.9 - 10.3 mg/dL 8.7  8.6  8.3       Latest Ref Rng & Units 08/31/2023    5:54 AM 08/30/2023    4:43 AM 08/29/2023    3:21 AM  CMP  Glucose 70 - 99 mg/dL 956  98  99   BUN 8 - 23 mg/dL 29  25  28    Creatinine 0.61 - 1.24 mg/dL 2.13  0.86  5.78   Sodium 135 - 145 mmol/L 139  139  140   Potassium 3.5 - 5.1 mmol/L 4.6  4.3  4.0   Chloride 98 - 111 mmol/L 109  111  108   CO2 22 - 32 mmol/L 24  21  22    Calcium 8.9 - 10.3 mg/dL 8.7  8.6  8.3     No results found for: "CHOL", "HDL", "LDLCALC", "LDLDIRECT", "TRIG", "CHOLHDL" No results for input(s): "LIPOA" in the last 8760 hours. No components found for: "NTPROBNP" No results for input(s): "PROBNP" in the last 8760 hours. Recent Labs    08/26/23 1437  TSH 3.950     Physical Exam:    Today's Vitals   09/13/23 1107  BP: 130/60  Pulse: (!) 48  Resp: 16  SpO2: 96%  Weight: 131 lb 3.2 oz (59.5 kg)  Height: 5\' 5"  (1.651 m)   Body mass index is 21.83 kg/m. Wt Readings from Last 3 Encounters:  09/13/23 131 lb 3.2 oz (59.5 kg)  09/13/23 128 lb 12.8 oz (58.4 kg)  08/27/23 123 lb (55.8 kg)    Physical Exam  Constitutional: No distress.  hemodynamically stable  Neck: No JVD present.  Cardiovascular: Regular rhythm, S1 normal and S2 normal. Frequent extrasystoles are present. Bradycardia present. Exam reveals no gallop, no S3 and no S4.  No murmur heard. Pulmonary/Chest: Effort normal. No stridor. He has no wheezes. He has no rales.  Decreased breath sounds bilaterally, poor inspiratory effort  Abdominal: Soft. Bowel sounds are normal. He exhibits no distension. There is no abdominal tenderness.  Musculoskeletal:        General: Edema (bilateral) present. No tenderness.     Cervical back: Neck supple.  Neurological:  He is alert and oriented to person, place, and time. He has intact cranial nerves (2-12).  Skin: Skin is warm.   Impression & Recommendation(s):  Impression:   ICD-10-CM   1. Precordial pain  R07.2 MYOCARDIAL PERFUSION IMAGING    Cardiac Stress Test: Informed Consent Details: Physician/Practitioner Attestation; Transcribe to consent form and obtain patient signature    2. Dyspnea on exertion  R06.09 Basic metabolic panel    Pro b natriuretic peptide (BNP)  furosemide (LASIX) 20 MG tablet    Pro b natriuretic peptide (BNP)    Basic metabolic panel    Basic metabolic panel    Pro b natriuretic peptide (BNP)    Pro b natriuretic peptide (BNP)    Basic metabolic panel    3. PVC (premature ventricular contraction)  I49.3 EKG 12-Lead    metoprolol tartrate (LOPRESSOR) 25 MG tablet    4. Acute deep vein thrombosis (DVT) of femoral vein of left lower extremity (HCC)  I82.412 AMB Referral to Deep Vein Thrombosis Clinic    5. Atherosclerosis of aorta (HCC)  I70.0 Cardiac Stress Test: Informed Consent Details: Physician/Practitioner Attestation; Transcribe to consent form and obtain patient signature    6. Pulmonary embolism, bilateral (HCC)  I26.99     7. Right bundle branch block (RBBB) on electrocardiogram (ECG)  I45.10        Recommendation(s):  Precordial pain Dyspnea on exertion His precordial pain is predominantly noncardiac based on symptoms.  But has multiple cardiovascular risk factors which warrants possible ischemic workup.  I had a discussion with his daughter in a separate room with regards to goals of care and a shared decision was to proceed forward with ischemic workup to see if he has obstructive disease. His stress test is abnormal with likely will proceed with left heart catheterization or percutaneous intervention.  That would not proceed forward with surgical revascularization if requested at the current moment in time. Lexiscan stress test to evaluate for reversible  ischemia, patient unable to exercise here for pharmacological stress test requested. Dyspnea on exertion: Improving, multifactorial Not in overt heart failure but most recent echocardiogram notes mildly reduced LVEF and grade 1 diastolic dysfunction.  He has bilateral lower extremity swelling.  Will check BMP and BNP.  Start Lasix 20 mg p.o. daily.  And recheck labs again in 1 week.   PVC (premature ventricular contraction) Was started on bisoprolol at the last office visit. Given his creatinine clearance unable to uptitrate bisoprolol at this time. Will start Lopressor 25 mg p.o. twice daily with holding parameters and after he starts the medication he is requested to proceed forward with a Zio patch to evaluate for PVC burden while on pharmacological therapy.  Acute deep vein thrombosis (DVT) of femoral vein of left lower extremity (HCC) Diagnosed in December 2024. Referred to DVT clinic for longitudinal follow-up and care  Pulmonary embolism, bilateral (HCC) Noted to have bilateral extensive DVTs in December 2024. Currently on anticoagulation. Patient informs me that his pulmonologist will manage the PE.  Orders Placed:  Orders Placed This Encounter  Procedures   Basic metabolic panel    Standing Status:   Future    Number of Occurrences:   1    Expected Date:   09/13/2023    Expiration Date:   09/12/2024   Pro b natriuretic peptide (BNP)    Standing Status:   Future    Number of Occurrences:   1    Expected Date:   09/13/2023    Expiration Date:   09/12/2024   Basic metabolic panel    Standing Status:   Future    Number of Occurrences:   1    Expected Date:   09/20/2023    Expiration Date:   09/12/2024   Pro b natriuretic peptide (BNP)    Standing Status:   Future    Number of Occurrences:   1    Expected Date:   09/20/2023    Expiration Date:  09/12/2024   AMB Referral to Deep Vein Thrombosis Clinic    Referral Priority:   Routine    Referral Type:   Consultation     Referral Reason:   Specialty Services Required    Number of Visits Requested:   1   Cardiac Stress Test: Informed Consent Details: Physician/Practitioner Attestation; Transcribe to consent form and obtain patient signature    Physician/Practitioner attestation of informed consent for procedure/surgical case:   I, the physician/practitioner, attest that I have discussed with the patient the benefits, risks, side effects, alternatives, likelihood of achieving goals and potential problems during recovery for the procedure that I have provided informed consent.    Procedure:   Lexiscan Nuclear Stress Test    Indication/Reason:   Precordial pain   MYOCARDIAL PERFUSION IMAGING    Standing Status:   Future    Expected Date:   09/20/2023    Expiration Date:   09/12/2024    Patient weight in lbs:   131    Where should this be performed?:   [facility]    Type of stress:   Lexiscan   EKG 12-Lead   As part of today's office visit reviewed CT chest PE protocol study results from 08/27/2023, lower extremity venous duplex ultrasound from 08/27/2023, discharge summary from 1//2025, discussed goals of care with patient and daughter, workup for precordial pain and dyspnea as noted above, medication changes, additional labs and diagnostic workup ordered.  Final Medication List:    Meds ordered this encounter  Medications   furosemide (LASIX) 20 MG tablet    Sig: Take 1 tablet (20 mg total) by mouth daily.   metoprolol tartrate (LOPRESSOR) 25 MG tablet    Sig: Take 1 tablet (25 mg total) by mouth 2 (two) times daily. HOLD if systolic blood pressure (top number) is less than 100 and/or if heart rate less than 55    Dispense:  180 tablet    Refill:  3    Switched from bisoprolol    Medications Discontinued During This Encounter  Medication Reason   bisoprolol (ZEBETA) 5 MG tablet Discontinued by provider     Current Outpatient Medications:    APIXABAN (ELIQUIS) VTE STARTER PACK  (10MG  AND 5MG ), Take as directed on package: start with two-5mg  tablets twice daily for 7 days. On day 8, switch to one-5mg  tablet twice daily., Disp: 74 each, Rfl: 0   furosemide (LASIX) 20 MG tablet, Take 1 tablet (20 mg total) by mouth daily., Disp: , Rfl:    metoprolol tartrate (LOPRESSOR) 25 MG tablet, Take 1 tablet (25 mg total) by mouth 2 (two) times daily. HOLD if systolic blood pressure (top number) is less than 100 and/or if heart rate less than 55, Disp: 180 tablet, Rfl: 3   omeprazole (PRILOSEC) 20 MG capsule, Take 1 capsule (20 mg total) by mouth 2 (two) times daily before a meal. (Patient taking differently: Take 20 mg by mouth as needed (heartburn and GERD).), Disp: 180 capsule, Rfl: 1  Consent:   Informed Consent   Shared Decision Making/Informed Consent The risks [chest pain, shortness of breath, cardiac arrhythmias, dizziness, blood pressure fluctuations, myocardial infarction, stroke/transient ischemic attack, nausea, vomiting, allergic reaction, radiation exposure, metallic taste sensation and life-threatening complications (estimated to be 1 in 10,000)], benefits (risk stratification, diagnosing coronary artery disease, treatment guidance) and alternatives of a nuclear stress test were discussed in detail with Mr. Toomes and he agrees to proceed.     Disposition:  3 month follow up   His questions and concerns were addressed to his satisfaction. He voices understanding of the recommendations provided during this encounter.    Signed, Tessa Lerner, DO, [facility]  [facility]  [address] [place], [place] [address] 09/13/2023 12:28 PM

## 2023-09-13 NOTE — Telephone Encounter (Signed)
Spoke with pt's daughter (per DPR on file) and let them know that per Dr. Odis Hollingshead we canceled the 09/20/23 echo d/t pt recently having once during hospital admission. Pt's daughter verbalized understanding.

## 2023-09-13 NOTE — Patient Instructions (Addendum)
You need a repeat CBC / BMET  when you see your heart doctor today and whatever tests they deem necessary   You will need 6 months of therapy and a recheck of the leg for clots before stopping eliquis  No need for lung scanning until your breathing with exercise does not normalize gradually over time.   Once cleared by cardiology ,  to  get the most out of exercise, you need to be continuously aware that you are slightly short of breath, but never out of breath, ideally building up to  30 minutes daily. As you improve, it will actually be easier for you to do the same amount of exercise  in  30 minutes so always push to the level where you are short of breath.     Make sure you check your oxygen saturations at highest level of activity    Please schedule a follow up visit in 3 months but call sooner if needed in the [place] office.

## 2023-09-13 NOTE — Patient Instructions (Addendum)
Medication Instructions:  Your physician has recommended you make the following change in your medication:   STOP Bisoprolol   START Metoprolol Tartrate (Lopressor) 25 mg twice daily HOLD if systolic blood pressure (top number) is less than 100 and/ or if heart rate less than 55 Take this medication while using the heart monitor.  START Furosemide (Lasix) 20 mg once daily    *If you need a refill on your cardiac medications before your next appointment, please call your pharmacy*  Lab Work: To be completed today: BMP and Pro-BNP  To be completed in 1 week: BMP and Pro-BNP  If you have labs (blood work) drawn today and your tests are completely normal, you will receive your results only by: MyChart Message (if you have MyChart) OR A paper copy in the mail If you have any lab test that is abnormal or we need to change your treatment, we will call you to review the results.  Testing/Procedures: Your physician has requested that you wear a Zio heart monitor for 3 days. This will be mailed to your home with instructions on how to apply the monitor and how to return it when finished. Please allow 2 weeks after returning the heart monitor before our office calls you with the results.  Your physician has requested that you have a Lexiscan Nuclear stress test.   Follow-Up: At [facility], you and your health needs are our priority.  As part of our continuing mission to provide you with exceptional heart care, we have created designated Provider Care Teams.  These Care Teams include your primary Cardiologist (physician) and Advanced Practice Providers (APPs -  Physician Assistants and Nurse Practitioners) who all work together to provide you with the care you need, when you need it.   Your next appointment:   3 month(s)  The format for your next appointment:   In Person  Provider:   Tessa Lerner, DO {  Other Instructions You have been referred to our DVT (deep vein thrombosis  clinic). Someone will be reaching out to you to schedule an appointment.  Lexiscan Myoview (Stress Test) Instructions  Please arrive 15 minutes prior to your appointment time for registration and insurance purposes.   The test will take approximately 3 to 4 hours to complete; you may bring reading material.  If someone comes with you to your appointment, they will need to remain in the main lobby due to limited space in the testing area. **If you are pregnant or breastfeeding, please notify the nuclear lab prior to your appointment**   How to prepare for your Myocardial Perfusion Test: Do not eat or drink 3 hours prior to your test, except you may have water. Do not consume products containing caffeine (regular or decaffeinated) 12 hours prior to your test. (ex: coffee, chocolate, sodas, tea). Do bring a list of your current medications with you.  If not listed below, you may take your medications as normal. Do wear comfortable clothes (no dresses or overalls) and walking shoes, tennis shoes preferred (No heels or open toe shoes are allowed). Do NOT wear cologne, perfume, aftershave, or lotions (deodorant is allowed). If these instructions are not followed, your test will have to be rescheduled.   Please report to [address], [address] for your test.  If you have questions or concerns about your appointment, you can call the Nuclear Lab at [phone].   If you cannot keep your appointment, please provide 24 hours notification to the Nuclear Lab,  to avoid a possible $50 charge to your account.   -------------------------------------------------------------------------------------------------------------------------------  ZIO XT- Long Term Monitor Instructions     Your physician has requested you wear a ZIO patch monitor for 3 days.  This is a single patch monitor. [employer] supplies one patch monitor per enrollment. Additional  stickers are not available. Please do not apply patch  if you will be having a Nuclear Stress Test,  Echocardiogram, Cardiac CT, MRI, or Chest Xray during the period you would be wearing the  monitor. The patch cannot be worn during these tests. You cannot remove and re-apply the  ZIO XT patch monitor.  Your ZIO patch monitor will be mailed 3 day USPS to your address on file. It may take 3-5 days  to receive your monitor after you have been enrolled.  Once you have received your monitor, please review the enclosed instructions. Your monitor  has already been registered assigning a specific monitor serial # to you.     Billing and Patient Assistance Program Information     We have supplied [employer] with any of your insurance information on file for billing purposes.  [employer] offers a sliding scale Patient Assistance Program for patients that do not have  insurance, or whose insurance does not completely cover the cost of the ZIO monitor.  You must apply for the Patient Assistance Program to qualify for this discounted rate.  To apply, please call [employer] at [phone], select option 4, select option 2, ask to apply for  Patient Assistance Program. Meredeth Ide will ask your household income, and how many people  are in your household. They will quote your out-of-pocket cost based on that information.  [employer] will also be able to set up a 46-month, interest-free payment plan if needed.     Applying the monitor     Shave hair from upper left chest.  Hold abrader disc by orange tab. Rub abrader in 40 strokes over the upper left chest as  indicated in your monitor instructions.  Clean area with 4 enclosed alcohol pads. Let dry.  Apply patch as indicated in monitor instructions. Patch will be placed under collarbone on left  side of chest with arrow pointing upward.  Rub patch adhesive wings for 2 minutes. Remove white label marked "1". Remove the white  label marked "2". Rub patch adhesive wings for 2 additional minutes.  While looking in a mirror,  press and release button in center of patch. A small green light will  flash 3-4 times. This will be your only indicator that the monitor has been turned on.  Do not shower for the first 24 hours. You may shower after the first 24 hours.  Press the button if you feel a symptom. You will hear a small click. Record Date, Time and  Symptom in the Patient Logbook.  When you are ready to remove the patch, follow instructions on the last 2 pages of Patient  Logbook. Stick patch monitor onto the last page of Patient Logbook.  Place Patient Logbook in the blue and white box. Use locking tab on box and tape box closed  securely. The blue and white box has prepaid postage on it. Please place it in the mailbox as  soon as possible. Your physician should have your test results approximately 7 days after the  monitor has been mailed back to [facility].  Call [facility] Customer Care at [phone] if you have questions regarding  your ZIO XT patch monitor. Call them immediately if  you see an orange light blinking on your  monitor.  If your monitor falls off in less than 4 days, contact our Monitor department at [phone].  If your monitor becomes loose or falls off after 4 days call [employer] at [phone] for  suggestions on securing your monitor.

## 2023-09-14 LAB — BASIC METABOLIC PANEL
BUN/Creatinine Ratio: 24 (ref 10–24)
BUN: 24 mg/dL (ref 8–27)
CO2: 22 mmol/L (ref 20–29)
Calcium: 9.3 mg/dL (ref 8.6–10.2)
Chloride: 106 mmol/L (ref 96–106)
Creatinine, Ser: 1.02 mg/dL (ref 0.76–1.27)
Glucose: 83 mg/dL (ref 70–99)
Potassium: 5.3 mmol/L — ABNORMAL HIGH (ref 3.5–5.2)
Sodium: 141 mmol/L (ref 134–144)
eGFR: 72 mL/min/{1.73_m2} (ref >59)

## 2023-09-14 LAB — PRO B NATRIURETIC PEPTIDE: NT-Pro BNP: 3168 pg/mL — ABNORMAL HIGH (ref 0–486)

## 2023-09-16 ENCOUNTER — Ambulatory Visit: Payer: Medicare Other | Attending: Cardiology

## 2023-09-16 ENCOUNTER — Other Ambulatory Visit: Payer: Self-pay | Admitting: *Deleted

## 2023-09-16 DIAGNOSIS — M7989 Other specified soft tissue disorders: Secondary | ICD-10-CM

## 2023-09-16 DIAGNOSIS — N1831 Chronic kidney disease, stage 3a: Secondary | ICD-10-CM

## 2023-09-16 DIAGNOSIS — R0609 Other forms of dyspnea: Secondary | ICD-10-CM

## 2023-09-16 NOTE — Progress Notes (Unsigned)
Enrolled for [employer] to mail a ZIO XT long term holter monitor to the patients address on file.   Patient called requesting monitor to be mailed.

## 2023-09-17 ENCOUNTER — Other Ambulatory Visit: Payer: Self-pay

## 2023-09-19 ENCOUNTER — Other Ambulatory Visit: Payer: Self-pay

## 2023-09-19 ENCOUNTER — Ambulatory Visit (HOSPITAL_COMMUNITY): Payer: Medicare Other

## 2023-09-19 ENCOUNTER — Ambulatory Visit (HOSPITAL_COMMUNITY): Payer: Medicare Other | Attending: Cardiology

## 2023-09-19 DIAGNOSIS — R072 Precordial pain: Secondary | ICD-10-CM | POA: Diagnosis not present

## 2023-09-19 DIAGNOSIS — I2699 Other pulmonary embolism without acute cor pulmonale: Secondary | ICD-10-CM

## 2023-09-19 DIAGNOSIS — I824Y2 Acute embolism and thrombosis of unspecified deep veins of left proximal lower extremity: Secondary | ICD-10-CM

## 2023-09-19 LAB — MYOCARDIAL PERFUSION IMAGING
Estimated workload: 1
Exercise duration (min): 1 min
Exercise duration (sec): 0 s
LV dias vol: 177 mL (ref 62–150)
LV sys vol: 90 mL
MPHR: 135 {beats}/min
Nuc Stress EF: 49 %
Peak HR: 71 {beats}/min
Percent HR: 52 %
Rest HR: 58 {beats}/min
Rest Nuclear Isotope Dose: 10.6 mCi
SDS: 0
SRS: 0
SSS: 0
ST Depression (mm): 0 mm
Stress Nuclear Isotope Dose: 32.5 mCi
TID: 1.09

## 2023-09-19 MED ORDER — TECHNETIUM TC 99M TETROFOSMIN IV KIT
10.6000 | PACK | Freq: Once | INTRAVENOUS | Status: AC | PRN
Start: 2023-09-19 — End: 2023-09-19
  Administered 2023-09-19: 10.6 via INTRAVENOUS

## 2023-09-19 MED ORDER — REGADENOSON 0.4 MG/5ML IV SOLN
0.4000 mg | Freq: Once | INTRAVENOUS | Status: AC
  Administered 2023-09-19: 0.4 mg via INTRAVENOUS

## 2023-09-19 MED ORDER — TECHNETIUM TC 99M TETROFOSMIN IV KIT
32.5000 | PACK | Freq: Once | INTRAVENOUS | Status: AC | PRN
Start: 2023-09-19 — End: 2023-09-19
  Administered 2023-09-19: 32.5 via INTRAVENOUS

## 2023-09-19 NOTE — Progress Notes (Signed)
New order for DVT clinic referral placed.

## 2023-09-20 ENCOUNTER — Other Ambulatory Visit (HOSPITAL_COMMUNITY): Payer: Medicare Other

## 2023-09-20 DIAGNOSIS — R072 Precordial pain: Secondary | ICD-10-CM | POA: Diagnosis not present

## 2023-09-21 LAB — BASIC METABOLIC PANEL
BUN/Creatinine Ratio: 20 (ref 10–24)
BUN: 23 mg/dL (ref 8–27)
CO2: 23 mmol/L (ref 20–29)
Calcium: 9.3 mg/dL (ref 8.6–10.2)
Chloride: 102 mmol/L (ref 96–106)
Creatinine, Ser: 1.17 mg/dL (ref 0.76–1.27)
Glucose: 102 mg/dL — ABNORMAL HIGH (ref 70–99)
Potassium: 5.1 mmol/L (ref 3.5–5.2)
Sodium: 141 mmol/L (ref 134–144)
eGFR: 61 mL/min/{1.73_m2} (ref >59)

## 2023-09-21 LAB — PRO B NATRIURETIC PEPTIDE: NT-Pro BNP: 1034 pg/mL — ABNORMAL HIGH (ref 0–486)

## 2023-09-23 DIAGNOSIS — I493 Ventricular premature depolarization: Secondary | ICD-10-CM | POA: Diagnosis not present

## 2023-09-30 ENCOUNTER — Encounter (HOSPITAL_COMMUNITY): Payer: Self-pay

## 2023-09-30 ENCOUNTER — Other Ambulatory Visit (HOSPITAL_COMMUNITY): Payer: Self-pay

## 2023-09-30 ENCOUNTER — Ambulatory Visit (HOSPITAL_COMMUNITY)
Admission: RE | Admit: 2023-09-30 | Discharge: 2023-09-30 | Disposition: A | Discharge status: Home / Self Care | Payer: Medicare Other | Source: Ambulatory Visit | Attending: Vascular Surgery | Admitting: Vascular Surgery

## 2023-09-30 ENCOUNTER — Telehealth (HOSPITAL_COMMUNITY): Payer: Self-pay

## 2023-09-30 VITALS — BP 147/62 | HR 46

## 2023-09-30 DIAGNOSIS — I824Y2 Acute embolism and thrombosis of unspecified deep veins of left proximal lower extremity: Secondary | ICD-10-CM | POA: Insufficient documentation

## 2023-09-30 DIAGNOSIS — I2609 Other pulmonary embolism with acute cor pulmonale: Secondary | ICD-10-CM | POA: Diagnosis not present

## 2023-09-30 MED ORDER — APIXABAN 5 MG PO TABS
5.0000 mg | ORAL_TABLET | Freq: Two times a day (BID) | ORAL | 4 refills | Status: DC
  Filled 2023-09-30: qty 60, 30d supply, fill #0
  Filled 2023-11-05 (×2): qty 60, 30d supply, fill #1
  Filled 2023-12-09: qty 60, 30d supply, fill #2
  Filled 2024-01-03: qty 60, 30d supply, fill #3

## 2023-09-30 NOTE — Progress Notes (Signed)
DVT Clinic Note  Name: David [name]     MRN: [id number]     DOB: 12/15/1935     Sex: male  PCP: Hilbert Bible, FNP  Today's Visit: Visit Information: Initial Visit  Referred to DVT Clinic by:  Cardiology  - Dr. Odis Hollingshead [facility]) Referred to CPP by: Dr. Chestine Spore Reason for referral:  Chief Complaint  Patient presents with   Med Management - DVT   HISTORY OF PRESENT ILLNESS: David [name] is a 88 y.o. male who presents for medication management of DVT. Patient was admitted 08/27/23-08/31/23 after being found to have worsening SOB and lower extremity tenderness at his cardiology appt that day, found to have acute LLE DVT, acute bilateral PE, and new onset CHF likely due to PE. He was discharged on Eliquis. He was seen by pulmonology on 09/13/23 who plans to anticoagulate for at least 6 months with repeat dopplers then will consider prophylactic dose Eliquis in the setting of unprovoked DVT. He was also seen by cardiology 09/13/23 and was referred to DVT Clinic for evaluation.   Today, patient arrives ambulating well independently and accompanied by his daughter David [name]. Endorses significant improvement in SOB as well as tenderness and swelling in left leg. He only has a couple days of Eliquis left in his starter pack and does not have any refills yet. Endorses medication adherence. Denies abnormal bleeding or bruising with Eliquis.   Positive Thrombotic Risk Factors: Older Age Bleeding Risk Factors: Age >65 years, Anticoagulant therapy  Negative Thrombotic Risk Factors: Previous VTE, Recent surgery (within 3 months), Recent trauma (within 3 months), Recent admission to hospital with acute illness (within 3 months), Paralysis, paresis, or recent plaster cast immobilization of lower extremity, Central venous catheterization, Bed rest >72 hours within 3 months, Sedentary journey lasting >8 hours within 4 weeks, Pregnancy, Within 6 weeks postpartum, Recent cesarean section (within 3 months),  Estrogen therapy, Testosterone therapy, Erythropoiesis-stimulating agent, Recent COVID diagnosis (within 3 months), Active cancer, Non-malignant, chronic inflammatory condition, Known thrombophilic condition, Smoking, Obesity  Rx Insurance Coverage: Medicare Rx Affordability: Patient has $375 deductible remaining on plan for 2025, so first copay of Eliquis will be $420. After deductible is met, Eliquis will be $45/month. He utilized the one-time free card to fill the VTE starter pack on 08/31/23. Preferred Pharmacy: Refills sent to [facility]  Past Medical History:  Diagnosis Date   Bell's palsy    COPD (chronic obstructive pulmonary disease) (HCC) 08/28/2023   Kidney stones    UTI (urinary tract infection)     Past Surgical History:  Procedure Laterality Date   TONSILLECTOMY      Social History   Socioeconomic History   Marital status: Widowed    Spouse name: Not on file   Number of children: 2   Years of education: Not on file   Highest education level: Not on file  Occupational History   Occupation: retired  Tobacco Use   Smoking status: Never   Smokeless tobacco: Never  Vaping Use   Vaping status: Never Used  Substance and Sexual Activity   Alcohol use: No   Drug use: No   Sexual activity: Not on file  Other Topics Concern   Not on file  Social History Narrative   Not on file   Social Drivers of Health   Financial Resource Strain: Not on file  Food Insecurity: Patient Declined (08/29/2023)   Hunger Vital Sign    Worried About Running Out of Food in the  Last Year: Patient declined    Barista in the Last Year: Patient declined  Transportation Needs: Patient Declined (08/29/2023)   PRAPARE - Administrator, Civil Service (Medical): Patient declined    Lack of Transportation (Non-Medical): Patient declined  Physical Activity: Not on file  Stress: Not on file  Social Connections: Patient Declined (08/29/2023)   Social Connection and  Isolation Panel [NHANES]    Frequency of Communication with Friends and Family: Patient declined    Frequency of Social Gatherings with Friends and Family: Patient declined    Attends Religious Services: Patient declined    Database administrator or Organizations: Patient declined    Attends Banker Meetings: Patient declined    Marital Status: Patient declined  Intimate Partner Violence: Patient Declined (08/29/2023)   Humiliation, Afraid, Rape, and Kick questionnaire    Fear of Current or Ex-Partner: Patient declined    Emotionally Abused: Patient declined    Physically Abused: Patient declined    Sexually Abused: Patient declined    Family History  Problem Relation Age of Onset   Heart disease Brother    Liver disease Neg Hx    Esophageal cancer Neg Hx    Colon cancer Neg Hx     Allergies as of 09/30/2023   (No Known Allergies)    Current Outpatient Medications on File Prior to Encounter  Medication Sig Dispense Refill   furosemide (LASIX) 20 MG tablet Take 1 tablet (20 mg total) by mouth daily.     metoprolol tartrate (LOPRESSOR) 25 MG tablet Take 1 tablet (25 mg total) by mouth 2 (two) times daily. HOLD if systolic blood pressure (top number) is less than 100 and/or if heart rate less than 55 180 tablet 3   omeprazole (PRILOSEC) 20 MG capsule Take 1 capsule (20 mg total) by mouth 2 (two) times daily before a meal. (Patient taking differently: Take 20 mg by mouth as needed (heartburn and GERD).) 180 capsule 1   No current facility-administered medications on file prior to encounter.   REVIEW OF SYSTEMS:  Review of Systems  Respiratory:  Positive for shortness of breath.   Cardiovascular:  Positive for leg swelling. Negative for chest pain and palpitations.  Musculoskeletal:  Negative for myalgias.  Neurological:  Negative for dizziness and tingling.   PHYSICAL EXAMINATION:  Vitals:   09/30/23 1013  BP: (!) 147/62  Pulse: (!) 46  SpO2: 99%   Physical  Exam Vitals reviewed.  Cardiovascular:     Rate and Rhythm: Bradycardia present.     Comments: HR in 40s is normal for the patient, per his daughter Pulmonary:     Effort: Pulmonary effort is normal.  Musculoskeletal:        General: Swelling (1+ bilateral edema, taking furosemide for this) present. No tenderness.  Skin:    Findings: No bruising or erythema.  Psychiatric:        Mood and Affect: Mood normal.        Behavior: Behavior normal.        Thought Content: Thought content normal.   Villalta Score for Post-Thrombotic Syndrome: Pain: Absent Cramps: Absent Heaviness: Absent Paresthesia: Absent Pruritus: Absent Pretibial Edema: Mild Skin Induration: Absent Hyperpigmentation: Absent Redness: Absent Venous Ectasia: Absent Pain on calf compression: Absent Villalta Preliminary Score: 1 Is venous ulcer present?: No If venous ulcer is present and score is <15, then 15 points total are assigned: Absent Villalta Total Score: 1  LABS:  CBC  Component Value Date/Time   WBC 7.2 08/31/2023 0554   RBC 4.66 08/31/2023 0554   HGB 13.5 08/31/2023 0554   HGB 14.6 08/26/2023 1437   HCT 41.5 08/31/2023 0554   HCT 45.1 08/26/2023 1437   PLT 177 08/31/2023 0554   PLT 193 08/26/2023 1437   MCV 89.1 08/31/2023 0554   MCV 88 08/26/2023 1437   MCH 29.0 08/31/2023 0554   MCHC 32.5 08/31/2023 0554   RDW 14.4 08/31/2023 0554   RDW 13.0 08/26/2023 1437   LYMPHSABS 1.4 08/27/2023 1853   LYMPHSABS 1.3 08/26/2023 1437   MONOABS 0.5 08/27/2023 1853   EOSABS 0.3 08/27/2023 1853   EOSABS 0.4 08/26/2023 1437   BASOSABS 0.1 08/27/2023 1853   BASOSABS 0.1 08/26/2023 1437    Hepatic Function      Component Value Date/Time   PROT 6.5 08/27/2023 1853   PROT 6.4 01/08/2020 1021   ALBUMIN 3.6 08/27/2023 1853   ALBUMIN 4.2 01/08/2020 1021   AST 23 08/27/2023 1853   ALT 14 08/27/2023 1853   ALKPHOS 59 08/27/2023 1853   BILITOT 0.6 08/27/2023 1853   BILITOT 0.4 01/08/2020 1021     Renal Function   Lab Results  Component Value Date   CREATININE 1.17 09/20/2023   CREATININE 1.02 09/13/2023   CREATININE 1.19 08/31/2023    Estimated Creatinine Clearance: 38.1 mL/min (by C-G formula based on SCr of 1.17 mg/dL).   VVS Vascular Lab Studies:  08/27/23 VAS [place] LOWER EXTREMITY VENOUS LEFT (DVT) Summary:  RIGHT:  - No evidence of common femoral vein obstruction.    LEFT:  - Findings consistent with acute deep vein thrombosis involving the SF  junction, left femoral vein, left popliteal vein, left posterior tibial  veins, and left peroneal veins.  - Findings consistent with acute superficial vein thrombosis involving the  left great saphenous vein, and left small saphenous vein.  Findings consistent with acute intramuscular thrombosis involving the left  gastrocnemius veins.  - No cystic structure found in the popliteal fossa.   08/27/23 CT angio (PE) IMPRESSION: Extensive bilateral pulmonary embolism with evidence of right heart strain as described above.   No other focal acute abnormality is noted.   Aortic Atherosclerosis (ICD10-I70.0) and Emphysema (ICD10-J43.9).  ASSESSMENT: Location of DVT: Left femoral vein, Left popliteal vein, Left distal vein, Left superficial vein Cause of DVT: unprovoked  Patient recently diagnosed with LLE DVT and bilateral pulmonary embolism with right heart strain. No prior history of VTE, and no clear provoking risk factors present. Pulmonology is following and plans for at least 6 months of full dose anticoagulation and will repeat dopplers. At that time, they will have risk benefit discussion with patient to consider decreasing dose of Eliquis to prophylactic dosing. Agree with pulmonology plan for anticoagulation. With DVT only as proximal as the femoral vein, no need for vascular surgery intervention. As the patient is nearing completion of the starter pack, provided him with refills of Eliquis to complete 6 months, since after  that time pulmonology may drop the dose. Counseled patient on the medication and discussed expected medication costs. No concerns for medication access or adherence at this time. Explained that in the setting of unprovoked DVT, we typically refer patients to hematology. Patient and daughter prefer to follow up with pulmonology again to see what the plan of care is at that time, and patient/daughter will reach out if they feel a hematology referral is needed. With cardiology and pulmonology on board, and no indication for vascular surgery  intervention, DVT Clinic will follow as needed. All of the patient's and daughter's questions have been answered at this time.   PLAN: -Continue apixaban (Eliquis) 5 mg twice daily. -Expected duration of therapy: at least 6 months of full dose anticoagulation with consideration of prophylactic dose Eliquis at that time, per pulmonology. Therapy started on 08/31/23. -Patient educated on purpose, proper use and potential adverse effects of apixaban (Eliquis). -Discussed importance of taking medication around the same time every day. -Advised patient of medications to avoid (NSAIDs, aspirin doses >100 mg daily). -Educated that Tylenol (acetaminophen) is the preferred analgesic to lower the risk of bleeding. -Advised patient to alert all providers of anticoagulation therapy prior to starting a new medication or having a procedure. -Emphasized importance of monitoring for signs and symptoms of bleeding (abnormal bruising, prolonged bleeding, nose bleeds, bleeding from gums, discolored urine, black tarry stools). -Educated patient to present to the ED if emergent signs and symptoms of new thrombosis occur. -Counseled patient to wear compression stockings daily, removing at night. Elevate legs daily to help improve swelling.   Follow up: with pulmonology and cardiology. DVT Clinic available as needed.   Pervis Hocking, PharmD, [place]y [place], CPP Deep Vein Thrombosis Clinic Clinical  Pharmacist Practitioner Office: [phone]

## 2023-09-30 NOTE — Patient Instructions (Signed)
-  Continue apixaban (Eliquis) 5 mg twice daily. -Your refills have been sent to [facility].  -It is important to take your medication around the same time every day.  -Avoid NSAIDs like ibuprofen (Advil, Motrin) and naproxen (Aleve) as well as aspirin doses over 100 mg daily. -Tylenol (acetaminophen) is the preferred over the counter pain medication to lower the risk of bleeding. -Be sure to alert all of your health care providers that you are taking an anticoagulant prior to starting a new medication or having a procedure. -Monitor for signs and symptoms of bleeding (abnormal bruising, prolonged bleeding, nose bleeds, bleeding from gums, discolored urine, black tarry stools). If you have fallen and hit your head OR if your bleeding is severe or not stopping, seek emergency care.  -Go to the emergency room if emergent signs and symptoms of new clot occur (new or worse swelling and pain in an arm or leg, shortness of breath, chest pain, fast or irregular heartbeats, lightheadedness, dizziness, fainting, coughing up blood) or if you experience a significant color change (pale or blue) in the extremity that has the DVT.  -We recommend you wear compression stockings (20-30 mmHg) as long as you are having swelling or pain. Be sure to purchase the correct size and take them off at night.   If you have any questions or need to reschedule an appointment, please call [phone] [facility].  If you are having an emergency, call 911 or present to the nearest emergency room.   What is a DVT?  -Deep vein thrombosis (DVT) is a condition in which a blood clot forms in a vein of the deep venous system which can occur in the lower leg, thigh, pelvis, arm, or neck. This condition is serious and can be life-threatening if the clot travels to the arteries of the lungs and causing a blockage (pulmonary embolism, PE). A DVT can also damage veins in the leg, which can lead to long-term venous disease, leg pain, swelling,  discoloration, and ulcers or sores (post-thrombotic syndrome).  -Treatment may include taking an anticoagulant medication to prevent more clots from forming and the current clot from growing, wearing compression stockings, and/or surgical procedures to remove or dissolve the clot.

## 2023-09-30 NOTE — Telephone Encounter (Signed)
Patient Advocate Encounter  Test claims for Eliquis indicate patient still has $375 remaining on deductible  $420 for 30 days $510 for 90 days  Copays $45 and $135 after deductible is met.  Burnell Blanks, CPhT Rx Patient Advocate Phone: [phone]

## 2023-10-07 ENCOUNTER — Ambulatory Visit (HOSPITAL_BASED_OUTPATIENT_CLINIC_OR_DEPARTMENT_OTHER): Payer: Medicare Other | Admitting: Family Medicine

## 2023-10-18 DIAGNOSIS — I493 Ventricular premature depolarization: Secondary | ICD-10-CM | POA: Diagnosis not present

## 2023-10-29 ENCOUNTER — Telehealth: Payer: Self-pay | Admitting: Cardiology

## 2023-10-29 NOTE — Telephone Encounter (Signed)
 Patient is returning phone call in regard to heart monitor results.

## 2023-10-29 NOTE — Telephone Encounter (Signed)
 Spoke with pt's daughter per DPR on file and went over heart monitor result note.

## 2023-11-05 ENCOUNTER — Other Ambulatory Visit: Payer: Self-pay

## 2023-11-05 ENCOUNTER — Other Ambulatory Visit (HOSPITAL_COMMUNITY): Payer: Self-pay

## 2023-11-28 ENCOUNTER — Other Ambulatory Visit: Payer: Self-pay | Admitting: Cardiology

## 2023-11-28 ENCOUNTER — Other Ambulatory Visit (HOSPITAL_COMMUNITY): Payer: Self-pay

## 2023-11-28 DIAGNOSIS — R0609 Other forms of dyspnea: Secondary | ICD-10-CM

## 2023-11-28 MED ORDER — FUROSEMIDE 20 MG PO TABS
20.0000 mg | ORAL_TABLET | Freq: Every day | ORAL | 3 refills | Status: AC
Start: 2023-11-28 — End: ?
  Filled 2023-11-28: qty 90, 90d supply, fill #0
  Filled 2024-02-17 – 2024-03-09 (×2): qty 90, 90d supply, fill #1
  Filled 2024-03-30: qty 90, 90d supply, fill #2
  Filled 2024-04-23: qty 30, 30d supply, fill #2
  Filled 2024-07-14: qty 30, 30d supply, fill #0
  Filled 2024-08-21: qty 30, 30d supply, fill #1

## 2023-12-09 ENCOUNTER — Other Ambulatory Visit (HOSPITAL_COMMUNITY): Payer: Self-pay

## 2023-12-13 ENCOUNTER — Ambulatory Visit: Payer: Medicare Other | Attending: Cardiology | Admitting: Cardiology

## 2023-12-13 ENCOUNTER — Other Ambulatory Visit (HOSPITAL_COMMUNITY): Payer: Self-pay

## 2023-12-13 VITALS — BP 158/66 | HR 49 | Ht 65.0 in | Wt 131.1 lb

## 2023-12-13 DIAGNOSIS — I82412 Acute embolism and thrombosis of left femoral vein: Secondary | ICD-10-CM

## 2023-12-13 DIAGNOSIS — I2699 Other pulmonary embolism without acute cor pulmonale: Secondary | ICD-10-CM

## 2023-12-13 DIAGNOSIS — I493 Ventricular premature depolarization: Secondary | ICD-10-CM

## 2023-12-13 DIAGNOSIS — I82512 Chronic embolism and thrombosis of left femoral vein: Secondary | ICD-10-CM | POA: Diagnosis not present

## 2023-12-13 DIAGNOSIS — R0609 Other forms of dyspnea: Secondary | ICD-10-CM | POA: Diagnosis not present

## 2023-12-13 MED ORDER — METOPROLOL TARTRATE 25 MG PO TABS
25.0000 mg | ORAL_TABLET | Freq: Three times a day (TID) | ORAL | 3 refills | Status: AC
  Filled 2023-12-13 – 2024-03-09 (×3): qty 180, 60d supply, fill #0
  Filled 2024-04-23: qty 180, 60d supply, fill #1
  Filled 2024-07-14: qty 180, 60d supply, fill #0

## 2023-12-13 NOTE — Patient Instructions (Signed)
 Medication Instructions:  Your physician has recommended you make the following change in your medication:   INCREASE frequency- Metoprolol  Tartrate (Lopressor ) 25 mg three times daily.   *If you need a refill on your cardiac medications before your next appointment, please call your pharmacy*  Lab Work: None ordered today. If you have labs (blood work) drawn today and your tests are completely normal, you will receive your results only by: MyChart Message (if you have MyChart) OR A paper copy in the mail If you have any lab test that is abnormal or we need to change your treatment, we will call you to review the results.  Testing/Procedures: None ordered today.  Follow-Up: At [facility], you and your health needs are our priority.  As part of our continuing mission to provide you with exceptional heart care, we have created designated Provider Care Teams.  These Care Teams include your primary Cardiologist (physician) and Advanced Practice Providers (APPs -  Physician Assistants and Nurse Practitioners) who all work together to provide you with the care you need, when you need it.  Your next appointment:   3 month(s)  The format for your next appointment:   In Person  Provider:   Olinda Bertrand, [facility]  Other Instructions    1st Floor: - Lobby - Registration  - Pharmacy  - Lab - Cafe  2nd Floor: - PV Lab - Diagnostic Testing (echo, CT, nuclear med)  3rd Floor: - Vacant  4th Floor: - TCTS (cardiothoracic surgery) - AFib Clinic - Structural Heart Clinic - Vascular Surgery  - Vascular Ultrasound  5th Floor: - HeartCare Cardiology (general and EP) - Clinical Pharmacy for coumadin, hypertension, lipid, weight-loss medications, and med management appointments    Valet parking services will be available as well.

## 2023-12-13 NOTE — Progress Notes (Unsigned)
 Cardiology Office Note:  .   Date:  12/13/2023  ID:  David [name], DOB 1936/07/14, MRN [id number] PCP:  No primary care provider on file.  [facility] Providers Cardiologist:  Olinda Bertrand, DO  Electrophysiologist:  None  Click to update primary MD,subspecialty MD or APP then REFRESH:1}    Chief Complaint  Patient presents with  . Follow-up    PVC    History of Present Illness: .   David [name] is a 88 y.o. Caucasian male whose past medical history and cardiovascular risk factors includes: bilateral extensive pulmonary embolism (August 27, 2023), DVT (left lower extremity 08/27/2023) aortic atherosclerosis, right bundle branch block, proximal ascending aorta dilatation per echocardiogram, advanced age.  Patient is accompanied by his daughter David [name] at today's office visit and she also provides collateral history during this encounter.    In December 2024 patient was seen for worsening shortness of breath as well as lower extremity swelling.  Clinical concern for possible venous thromboembolic event was sent to the hospital for further evaluation and management.  He was hospitalized in December 2024/January 2025 for acute bilateral PE and acute DVTs.  Echocardiogram during the hospitalization noted mildly reduced LVEF with no regional wall motion abnormalities.  After being treated for bilateral pulmonary embolism at follow-up he endorsed precordial chest pain/heaviness.  Shared decision was to proceed with stress test.  He underwent myocardial perfusion imaging in January 2025 which was reported to be low risk.  He also underwent cardiac monitor results reviewed with him and his daughter at the bedside.  ***  Review of Systems: .   Review of Systems  Cardiovascular:  Negative for chest pain, claudication, irregular heartbeat, leg swelling, near-syncope, orthopnea, palpitations, paroxysmal nocturnal dyspnea and syncope.  Respiratory:  Negative for shortness of breath.    Hematologic/Lymphatic: Negative for bleeding problem.    Studies Reviewed:   Echocardiogram: March 2024: LVEF 55 to 60%, indeterminate diastolic pattern, moderate LAE, moderate MR, mild to moderate AR, estimated RAP 8 mmHg, frequent PVCs noted  January 2025: LVEF 40-45%, grade 1 diastolic dysfunction, right ventricular size is mildly enlarged, RV systolic function is normal, trivial MR, mild AR, estimated RAP 3 mmHg.  MPI 09/19/23   The study is normal. The study is low risk.   No ST deviation was noted.   LV perfusion is normal. There is no evidence of ischemia. There is no evidence of infarction.   Left ventricular function is normal. Nuclear stress EF: 49%. The left ventricular ejection fraction is mildly decreased (45-54%). End diastolic cavity size is moderately enlarged. End systolic cavity size is moderately enlarged.  Cardiac monitor (Zio Patch): September 23, 2023-September 28, 2023 Dominant rhythm sinus. Heart rate 39-148 bpm.  Avg HR 67 bpm. No atrial fibrillation detected during the monitoring period. No ventricular tachycardia, high grade AV block, pauses (3 seconds or longer). Total supraventricular ectopic burden <1%. 1 episode of PSVT, 09/24/2023 at 1:28 PM, 10 beats, 5.2 seconds in duration, average HR 131 bpm, max HR 148 bpm Total ventricular ectopic burden 11.4% (predominantly isolated beats). Patient triggered events: 0.   RADIOLOGY: Chest xray:  08/26/2023 1. No acute cardiopulmonary disease. 2. Chronic hyperinflation and coarsened interstitial markings suggestive of COPD.   Risk Assessment/Calculations:   NA  Labs:       Latest Ref Rng & Units 08/31/2023    5:54 AM 08/30/2023    4:43 AM 08/29/2023    3:21 AM  CBC  WBC 4.0 - 10.5 K/uL 7.2  7.3  7.7   Hemoglobin 13.0 - 17.0 g/dL 14.7  82.9  56.2   Hematocrit 39.0 - 52.0 % 41.5  39.9  40.8   Platelets 150 - 400 K/uL 177  163  177        Latest Ref Rng & Units 09/20/2023    1:17 PM 09/13/2023    1:31  PM 08/31/2023    5:54 AM  BMP  Glucose 70 - 99 mg/dL 130  83  865   BUN 8 - 27 mg/dL 23  24  29    Creatinine 0.76 - 1.27 mg/dL 7.84  6.96  2.95   BUN/Creat Ratio 10 - 24 20  24     Sodium 134 - 144 mmol/L 141  141  139   Potassium 3.5 - 5.2 mmol/L 5.1  5.3  4.6   Chloride 96 - 106 mmol/L 102  106  109   CO2 20 - 29 mmol/L 23  22  24    Calcium 8.6 - 10.2 mg/dL 9.3  9.3  8.7       Latest Ref Rng & Units 09/20/2023    1:17 PM 09/13/2023    1:31 PM 08/31/2023    5:54 AM  CMP  Glucose 70 - 99 mg/dL 284  83  132   BUN 8 - 27 mg/dL 23  24  29    Creatinine 0.76 - 1.27 mg/dL 4.40  1.02  7.25   Sodium 134 - 144 mmol/L 141  141  139   Potassium 3.5 - 5.2 mmol/L 5.1  5.3  4.6   Chloride 96 - 106 mmol/L 102  106  109   CO2 20 - 29 mmol/L 23  22  24    Calcium 8.6 - 10.2 mg/dL 9.3  9.3  8.7     No results found for: "CHOL", "HDL", "LDLCALC", "LDLDIRECT", "TRIG", "CHOLHDL" No results for input(s): "LIPOA" in the last 8760 hours. No components found for: "NTPROBNP" Recent Labs    09/13/23 1331 09/20/23 1317  PROBNP 3,168* 1,034*   Recent Labs    08/26/23 1437  TSH 3.950     Physical Exam:    Today's Vitals   12/13/23 1028  BP: (!) 158/66  Pulse: (!) 49  SpO2: 98%  Weight: 131 lb 1.6 oz (59.5 kg)  Height: 5\' 5"  (1.651 m)   Body mass index is 21.82 kg/m. Wt Readings from Last 3 Encounters:  12/13/23 131 lb 1.6 oz (59.5 kg)  09/19/23 131 lb (59.4 kg)  09/13/23 131 lb 3.2 oz (59.5 kg)    Physical Exam  Constitutional: No distress.  hemodynamically stable  Neck: No JVD present.  Cardiovascular: Regular rhythm, S1 normal and S2 normal. Frequent extrasystoles are present. Bradycardia present. Exam reveals no gallop, no S3 and no S4.  No murmur heard. Pulmonary/Chest: Effort normal. No stridor. He has no wheezes. He has no rales.  Decreased breath sounds bilaterally, poor inspiratory effort  Abdominal: Soft. Bowel sounds are normal. He exhibits no distension. There is no  abdominal tenderness.  Musculoskeletal:        General: No tenderness or edema.     Cervical back: Neck supple.  Neurological: He is alert and oriented to person, place, and time. He has intact cranial nerves (2-12).  Skin: Skin is warm.   Impression & Recommendation(s):  Impression: No diagnosis found.    Recommendation(s):  Precordial pain Dyspnea on exertion His precordial pain is predominantly noncardiac based on symptoms.  But has multiple cardiovascular risk factors which warrants  possible ischemic workup.  I had a discussion with his daughter in a separate room with regards to goals of care and a shared decision was to proceed forward with ischemic workup to see if he has obstructive disease. His stress test is abnormal with likely will proceed with left heart catheterization or percutaneous intervention.  That would not proceed forward with surgical revascularization if requested at the current moment in time. Lexiscan  stress test to evaluate for reversible ischemia, patient unable to exercise here for pharmacological stress test requested. Dyspnea on exertion: Improving, multifactorial Not in overt heart failure but most recent echocardiogram notes mildly reduced LVEF and grade 1 diastolic dysfunction.  He has bilateral lower extremity swelling.  Will check BMP and BNP.  Start Lasix  20 mg p.o. daily.  And recheck labs again in 1 week.   PVC (premature ventricular contraction) Was started on bisoprolol  at the last office visit. Given his creatinine clearance unable to uptitrate bisoprolol  at this time. Will start Lopressor  25 mg p.o. twice daily with holding parameters and after he starts the medication he is requested to proceed forward with a Zio patch to evaluate for PVC burden while on pharmacological therapy.  Acute deep vein thrombosis (DVT) of femoral vein of left lower extremity (HCC) Diagnosed in December 2024. Referred to DVT clinic for longitudinal follow-up and  care  Pulmonary embolism, bilateral (HCC) Noted to have bilateral extensive DVTs in December 2024. Currently on anticoagulation. Patient informs me that his pulmonologist will manage the PE.  Orders Placed:  No orders of the defined types were placed in this encounter.  As part of today's office visit reviewed CT chest PE protocol study results from 08/27/2023, lower extremity venous duplex ultrasound from 08/27/2023, discharge summary from 1//2025, discussed goals of care with patient and daughter, workup for precordial pain and dyspnea as noted above, medication changes, additional labs and diagnostic workup ordered.  Final Medication List:    No orders of the defined types were placed in this encounter.   There are no discontinued medications.    Current Outpatient Medications:  .  apixaban  (ELIQUIS ) 5 MG TABS tablet, Take 1 tablet (5 mg total) by mouth 2 (two) times daily., Disp: 60 tablet, Rfl: 4 .  furosemide  (LASIX ) 20 MG tablet, Take 1 tablet (20 mg total) by mouth daily., Disp: 90 tablet, Rfl: 3 .  metoprolol  tartrate (LOPRESSOR ) 25 MG tablet, Take 1 tablet (25 mg total) by mouth 2 (two) times daily. HOLD if systolic blood pressure (top number) is less than 100 and/or if heart rate less than 55, Disp: 180 tablet, Rfl: 3 .  omeprazole  (PRILOSEC) 20 MG capsule, Take 1 capsule (20 mg total) by mouth 2 (two) times daily before a meal. (Patient taking differently: Take 20 mg by mouth as needed (heartburn and GERD).), Disp: 180 capsule, Rfl: 1  Consent:   Informed Consent   Shared Decision Making/Informed Consent The risks [chest pain, shortness of breath, cardiac arrhythmias, dizziness, blood pressure fluctuations, myocardial infarction, stroke/transient ischemic attack, nausea, vomiting, allergic reaction, radiation exposure, metallic taste sensation and life-threatening complications (estimated to be 1 in 10,000)], benefits (risk stratification, diagnosing coronary artery disease,  treatment guidance) and alternatives of a nuclear stress test were discussed in detail with David [name] and he agrees to proceed.     Disposition:   3 month follow up   His questions and concerns were addressed to his satisfaction. He voices understanding of the recommendations provided during this encounter.    Signed, Merrin Mcvicker  Fredrich Jefferson, [facility]  [facility]  [address] [place], [place] [address] 12/13/2023 11:05 AM

## 2023-12-15 ENCOUNTER — Encounter: Payer: Self-pay | Admitting: Cardiology

## 2023-12-16 ENCOUNTER — Other Ambulatory Visit (HOSPITAL_COMMUNITY): Payer: Self-pay

## 2023-12-16 ENCOUNTER — Encounter (HOSPITAL_BASED_OUTPATIENT_CLINIC_OR_DEPARTMENT_OTHER): Payer: Self-pay | Admitting: Family Medicine

## 2023-12-16 ENCOUNTER — Telehealth (HOSPITAL_BASED_OUTPATIENT_CLINIC_OR_DEPARTMENT_OTHER): Payer: Self-pay | Admitting: *Deleted

## 2023-12-16 ENCOUNTER — Ambulatory Visit (HOSPITAL_BASED_OUTPATIENT_CLINIC_OR_DEPARTMENT_OTHER): Admitting: Family Medicine

## 2023-12-16 VITALS — BP 158/68 | HR 46 | Ht 65.0 in | Wt 133.8 lb

## 2023-12-16 DIAGNOSIS — I493 Ventricular premature depolarization: Secondary | ICD-10-CM

## 2023-12-16 DIAGNOSIS — L918 Other hypertrophic disorders of the skin: Secondary | ICD-10-CM

## 2023-12-16 DIAGNOSIS — L309 Dermatitis, unspecified: Secondary | ICD-10-CM | POA: Diagnosis not present

## 2023-12-16 DIAGNOSIS — N183 Chronic kidney disease, stage 3 unspecified: Secondary | ICD-10-CM | POA: Diagnosis not present

## 2023-12-16 DIAGNOSIS — I2699 Other pulmonary embolism without acute cor pulmonale: Secondary | ICD-10-CM

## 2023-12-16 MED ORDER — TRIAMCINOLONE ACETONIDE 0.1 % EX OINT
1.0000 | TOPICAL_OINTMENT | Freq: Two times a day (BID) | CUTANEOUS | 6 refills | Status: DC
Start: 2023-12-16 — End: 2024-02-01
  Filled 2023-12-16: qty 60, 30d supply, fill #0

## 2023-12-16 NOTE — Progress Notes (Addendum)
 Subjective:   David [name] 09-29-1935 12/16/2023  Chief Complaint  Patient presents with   Rash    Pt has a rash on the upper part of his body that has been there for at least two weeks. States that it does itch and bother him when he is trying to sleep.    HPI: David [name] is a 88 year old male with history of aortic atherosclerosis, pancreatic atrophy, DDD, acute PE with left L DVT, CKD stage IIIa, COPD who presents today for re-assessment and management of chronic medical conditions.  ACUTE PE WITH Left DVT:  Patient presented to cardiology in December 2024 for shortness of breath and lower extremity swelling.  He was hospitalized in December 2024 to January 2025 for acute bilateral PE and acute DVTs.  His echo during that time showed mildly reduced LVEF with no regional wall motion abnormalities.  He was started on Eliquis  therapy for approximately 6 months with plan for repeat Dopplers per pulmonology.  He has an appointment to see Dr. Diania Fortes in June 2025.  Patient underwent myocardial perfusion imaging January 2025 which indicated low risk.  He continues to have recurring PVCs and has been seen recently by cardiology on 12/13/2023.  He is taking Lasix  in the morning and continues to take metoprolol  25 mg twice daily.  He recently had his metoprolol  increased to 25 mg 3 times daily and is tolerating well at this time.  Patient has chronic bradycardia.  Last echocardiogram revealed LVEF 55 to 60%.   RASH: Onset: Several months Location: Upper back, left axillae    Course of rash: Patient reports rash ongoing for "a long time" for several months that is "itching" and does not relieve with over-the-counter creams.  Patient denies drainage, bleeding, new exposures to soaps, fragrances, environmental allergens or new foods that could have caused rash.  Patient denies lesion or pustule formation.        Pruritis: yes  Tenderness: no  Feeling ill: no  Fever: no  Mouth lesions:  no  Facial/tongue swelling/difficulty breathing:  no  Diabetic or immunocompromised: no    HYPERTENSION: David [name] presents for the medical management of hypertension.  Patient's current hypertension medication regimen is: Metoprolol  25mg  TID (recently changed by Dr. Norma Beckers with Cardiology on 12/13/2023 from twice daily dosing to 3 times daily for the next few weeks to see if tolerated).  Patient is scheduled to see cardiology in July 2025.  He does not monitor blood pressure or pulse rate at home.  Per daughter who accompany his today, they will be obtaining a new blood pressure cuff for him to use.  Patient is  currently taking prescribed medications for HTN.  Patient is NOT regularly keeping a check on BP at home.  Adhering to low sodium diet: Yes Exercising Regularly: Limited activity  Denies headache, dizziness, CP, SHOB, vision changes.    BP Readings from Last 3 Encounters:  12/16/23 (!) 158/68  12/13/23 (!) 158/66  09/30/23 (!) 147/62    CHRONIC KIDNEY DISEASE: David [name] presents for the medical management of Chronic Kidney Disease stage 3a.  Patient is  adhering to renal diet. Patient is not on ACE1/ARB therapy.  Patient is  avoiding NSAIDS.     Does not attend dialysis and does not perform peritoneal dialysis.   Lab Results  Component Value Date   NA 141 09/20/2023   K 5.1 09/20/2023   CO2 23 09/20/2023   GLUCOSE 102 (H) 09/20/2023  BUN 23 09/20/2023   CREATININE 1.17 09/20/2023   CALCIUM 9.3 09/20/2023   EGFR 61 09/20/2023   GFRNONAA 60 (L) 08/31/2023      The following portions of the patient's history were reviewed and updated as appropriate: past medical history, past surgical history, family history, social history, allergies, medications, and problem list.   Patient Active Problem List   Diagnosis Date Noted   Acute deep vein thrombosis (DVT) of proximal vein of left lower extremity (HCC) 08/28/2023   COPD (chronic obstructive pulmonary  disease) (HCC) 08/28/2023   CKD stage 3a, GFR 45-59 ml/min (HCC) 08/28/2023   Acute pulmonary embolism  with L DVT 08/27/2023   Dyspnea on exertion 09/24/2022   Aortic atherosclerosis (HCC) 09/24/2022   Diverticulosis 09/24/2022   Right inguinal hernia 09/24/2022   Pancreatic atrophy 09/24/2022   Left inguinal hernia 09/24/2022   Hiatal hernia 09/24/2022   DDD (degenerative disc disease), thoracolumbar 09/24/2022   Right bundle branch block (RBBB) on electrocardiogram (ECG) 09/24/2022   Left anterior fascicular block (LAFB) determined by electrocardiography 09/24/2022   Past Medical History:  Diagnosis Date   Bell's palsy    COPD (chronic obstructive pulmonary disease) (HCC) 08/28/2023   Kidney stones    UTI (urinary tract infection)    Past Surgical History:  Procedure Laterality Date   TONSILLECTOMY     Family History  Problem Relation Age of Onset   Heart disease Brother    Liver disease Neg Hx    Esophageal cancer Neg Hx    Colon cancer Neg Hx    Outpatient Medications Prior to Visit  Medication Sig Dispense Refill   apixaban  (ELIQUIS ) 5 MG TABS tablet Take 1 tablet (5 mg total) by mouth 2 (two) times daily. 60 tablet 4   diphenhydrAMINE HCl (BENADRYL ALLERGY PO) Take 1 tablet by mouth at bedtime as needed (itching).     furosemide  (LASIX ) 20 MG tablet Take 1 tablet (20 mg total) by mouth daily. 90 tablet 3   metoprolol  tartrate (LOPRESSOR ) 25 MG tablet Take 1 tablet (25 mg total) by mouth 3 (three) times daily. HOLD if systolic blood pressure (top number) is less than 100 and/or if heart rate less than 55 180 tablet 3   omeprazole  (PRILOSEC) 20 MG capsule Take 1 capsule (20 mg total) by mouth 2 (two) times daily before a meal. (Patient taking differently: Take 20 mg by mouth as needed (heartburn and GERD).) 180 capsule 1   No facility-administered medications prior to visit.   No Known Allergies   ROS: A complete ROS was performed with pertinent positives/negatives  noted in the HPI. The remainder of the ROS are negative.    Objective:   Today's Vitals   12/16/23 1449 12/16/23 1519  BP: (!) 176/60 (!) 158/68  Pulse: (!) 43 (!) 46  SpO2: 100%   Weight: 133 lb 12.8 oz (60.7 kg)   Height: 5\' 5"  (1.651 m)     Physical Exam          GENERAL: Well-appearing, in NAD. Well nourished.  SKIN: Pink, warm and dry. No acute rash present. Multiple seborrheic keratoses present to diffuse back and bilateral upper extremities. Dry, flaky skin present to upper back. Multiple pigmented nevi present to trunk, back and upper extremities. Inflamed red acrochordon present to left axillae.  Head: Normocephalic. NECK: Trachea midline. Full ROM w/o pain or tenderness.  THROAT: Uvula midline. Oropharynx clear. Mucous membranes pink and moist.  RESPIRATORY: Chest wall symmetrical. Respirations even and non-labored. Breath sounds  clear to auscultation bilaterally.  CARDIAC: S1, S2 present, bradycardic rate and rhythm with PVCs. No murmur or gallops. Peripheral pulses 2+ bilaterally.  MSK: Muscle tone and strength appropriate for age.  NEUROLOGIC: No motor or sensory deficits. Steady, even gait. C2-C12 intact.  PSYCH/MENTAL STATUS: Alert, oriented x 3. Cooperative, appropriate mood and affect.     Assessment & Plan:   1. Stage 3 chronic kidney disease, unspecified whether stage 3a or 3b CKD (HCC) (Primary) Stable.  Will obtain BMP to evaluate GFR and obtain CBC given chronic CKD for signs of anemia and with current anticoagulation for PE and DVT. - Basic Metabolic Panel (BMET) - CBC with Differential  2. Dermatitis Likely age-related skin changes causing dryness and pruritus.  Discussed multiple seborrheic keratoses and benefit versus risk of seeking treatment and dermatology consult for multiple pigmented nevi, patient declines at this time.  Will trial triamcinolone  cream twice daily as needed to affected areas.  Reach out to PCP if no improvement in 1 to 2 weeks. -  triamcinolone  ointment (KENALOG ) 0.1 %; Apply 1 Application topically 2 (two) times daily to affected areas.  Dispense: 60 g; Refill: 6  3. Inflamed acrochordon Recommend removal from left axilla of inflamed acrochordon.  Patient will call to schedule appointment.  4. PVC (premature ventricular contraction) Currently managed by cardiology.  BP improved with manual recheck.  PCP recommends patient check blood pressure at least 2-3 times a week and keep a log.  If blood pressure does not improve within 1 to 2 weeks of medication changes, reach out to cardiology.   5. Acute pulmonary embolism, unspecified pulmonary embolism type, unspecified whether acute cor pulmonale present (HCC) Stable.  Patient denies leg swelling, shortness of breath at this time.  He is scheduled to see pulmonology in June 2025.  We discussed likely benefit of anticoagulation for at least 6 months and patient verbalized understanding.  Will obtain CBC with lab work today.   Meds ordered this encounter  Medications   triamcinolone  ointment (KENALOG ) 0.1 %    Sig: Apply 1 Application topically 2 (two) times daily to affected areas.    Dispense:  60 g    Refill:  6    Supervising Provider:   DE Peru, RAYMOND J [[id number]   Lab Orders         Basic Metabolic Panel (BMET)         CBC with Differential     Return in about 3 months (around 03/16/2024) for Follow up CKD, HTN.    Patient to reach out to office if new, worrisome, or unresolved symptoms arise or if no improvement in patient's condition. Patient verbalized understanding and is agreeable to treatment plan. All questions answered to patient's satisfaction.    Nonda Bays, [place]

## 2023-12-16 NOTE — Telephone Encounter (Signed)
 FYI

## 2023-12-16 NOTE — Telephone Encounter (Signed)
 Copied from CRM [id number]. Topic: Appointments - Scheduling Inquiry for Clinic >> Dec 13, 2023 12:08 PM David [name] wrote: Reason for CRM: Patient has no pcp on file. The patient has an appt on 4/21 at 3:10pm with Sylvester Evert.  The appt is listed as an office visit instead of a new patient . Patient daughter David [name]) would like for [employer] to be the patient pcp.

## 2023-12-16 NOTE — Patient Instructions (Signed)
 Please check BP at least 2-3 x a week. If over 150/90, reach out to PCP.

## 2023-12-17 ENCOUNTER — Encounter (HOSPITAL_BASED_OUTPATIENT_CLINIC_OR_DEPARTMENT_OTHER): Payer: Self-pay | Admitting: Family Medicine

## 2023-12-17 LAB — BASIC METABOLIC PANEL WITH GFR
BUN/Creatinine Ratio: 22 (ref 10–24)
BUN: 22 mg/dL (ref 8–27)
CO2: 23 mmol/L (ref 20–29)
Calcium: 9 mg/dL (ref 8.6–10.2)
Chloride: 104 mmol/L (ref 96–106)
Creatinine, Ser: 0.99 mg/dL (ref 0.76–1.27)
Glucose: 83 mg/dL (ref 70–99)
Potassium: 4.3 mmol/L (ref 3.5–5.2)
Sodium: 142 mmol/L (ref 134–144)
eGFR: 74 mL/min/{1.73_m2} (ref >59)

## 2023-12-17 LAB — CBC WITH DIFFERENTIAL/PLATELET
Basophils Absolute: 0.1 10*3/uL (ref 0.0–0.2)
Basos: 1 %
EOS (ABSOLUTE): 0.3 10*3/uL (ref 0.0–0.4)
Eos: 4 %
Hematocrit: 42.7 % (ref 37.5–51.0)
Hemoglobin: 14.2 g/dL (ref 13.0–17.7)
Immature Grans (Abs): 0 10*3/uL (ref 0.0–0.1)
Immature Granulocytes: 1 %
Lymphocytes Absolute: 1.7 10*3/uL (ref 0.7–3.1)
Lymphs: 21 %
MCH: 28.6 pg (ref 26.6–33.0)
MCHC: 33.3 g/dL (ref 31.5–35.7)
MCV: 86 fL (ref 79–97)
Monocytes Absolute: 0.6 10*3/uL (ref 0.1–0.9)
Monocytes: 8 %
Neutrophils Absolute: 5.4 10*3/uL (ref 1.4–7.0)
Neutrophils: 65 %
Platelets: 206 10*3/uL (ref 150–450)
RBC: 4.96 x10E6/uL (ref 4.14–5.80)
RDW: 13.8 % (ref 11.6–15.4)
WBC: 8.2 10*3/uL (ref 3.4–10.8)

## 2023-12-17 NOTE — Progress Notes (Signed)
 Hi David [name],  Your BMP shows improved kidney function and stable electrolytes. Your CBC is also normal without signs of anemia. We will plan to monitor your function at your next visit. If you have any questions, please reach out.

## 2024-01-03 ENCOUNTER — Other Ambulatory Visit (HOSPITAL_COMMUNITY): Payer: Self-pay

## 2024-01-30 ENCOUNTER — Emergency Department (HOSPITAL_COMMUNITY)

## 2024-01-30 ENCOUNTER — Other Ambulatory Visit: Payer: Self-pay

## 2024-01-30 ENCOUNTER — Encounter (HOSPITAL_COMMUNITY): Payer: Self-pay

## 2024-01-30 ENCOUNTER — Observation Stay (HOSPITAL_COMMUNITY)
Admission: EM | Admit: 2024-01-30 | Discharge: 2024-02-01 | Disposition: A | Discharge status: Home / Self Care | Attending: Internal Medicine | Admitting: Internal Medicine

## 2024-01-30 DIAGNOSIS — E860 Dehydration: Secondary | ICD-10-CM | POA: Diagnosis not present

## 2024-01-30 DIAGNOSIS — R42 Dizziness and giddiness: Principal | ICD-10-CM

## 2024-01-30 DIAGNOSIS — G319 Degenerative disease of nervous system, unspecified: Secondary | ICD-10-CM | POA: Diagnosis not present

## 2024-01-30 DIAGNOSIS — I712 Thoracic aortic aneurysm, without rupture, unspecified: Secondary | ICD-10-CM | POA: Diagnosis not present

## 2024-01-30 DIAGNOSIS — J449 Chronic obstructive pulmonary disease, unspecified: Secondary | ICD-10-CM | POA: Diagnosis not present

## 2024-01-30 DIAGNOSIS — R531 Weakness: Secondary | ICD-10-CM

## 2024-01-30 DIAGNOSIS — Z86718 Personal history of other venous thrombosis and embolism: Secondary | ICD-10-CM | POA: Diagnosis not present

## 2024-01-30 DIAGNOSIS — K219 Gastro-esophageal reflux disease without esophagitis: Secondary | ICD-10-CM | POA: Diagnosis not present

## 2024-01-30 DIAGNOSIS — Z79899 Other long term (current) drug therapy: Secondary | ICD-10-CM | POA: Diagnosis not present

## 2024-01-30 DIAGNOSIS — F1290 Cannabis use, unspecified, uncomplicated: Secondary | ICD-10-CM | POA: Diagnosis present

## 2024-01-30 DIAGNOSIS — I160 Hypertensive urgency: Secondary | ICD-10-CM | POA: Diagnosis present

## 2024-01-30 DIAGNOSIS — Z86711 Personal history of pulmonary embolism: Secondary | ICD-10-CM | POA: Diagnosis present

## 2024-01-30 NOTE — ED Triage Notes (Signed)
 Pt BIB [facility] from home for generalized weakness x a few days. VSS. Pt states that he has felt dizzy lately but hasn't fallen.

## 2024-01-30 NOTE — ED Provider Triage Note (Signed)
 Emergency Medicine Provider Triage Evaluation Note  David [name] , a 88 y.o. male  was evaluated in triage.  Pt complains of weakness and dizziness for past day.  Review of Systems  Positive: dizziness Negative: headache  Physical Exam  There were no vitals taken for this visit. Gen:   Awake, no distress  disheveled Resp:  Normal effort  MSK:   Moves extremities without difficulty  Other:  No focal weakness.  No visible trauma to extremities/torso while sitting in [facility]  Medical Decision Making  Medically screening exam initiated at 11:45 PM.  Appropriate orders placed.  David [name] was informed that the remainder of the evaluation will be completed by another provider, this initial triage assessment does not replace that evaluation, and the importance of remaining in the ED until their evaluation is complete.     Eldon Greenland, MD 01/30/24 [id number]

## 2024-01-31 ENCOUNTER — Emergency Department (HOSPITAL_COMMUNITY)

## 2024-01-31 ENCOUNTER — Other Ambulatory Visit (HOSPITAL_COMMUNITY)

## 2024-01-31 DIAGNOSIS — Z86711 Personal history of pulmonary embolism: Secondary | ICD-10-CM

## 2024-01-31 DIAGNOSIS — I160 Hypertensive urgency: Secondary | ICD-10-CM | POA: Diagnosis not present

## 2024-01-31 DIAGNOSIS — I712 Thoracic aortic aneurysm, without rupture, unspecified: Secondary | ICD-10-CM | POA: Diagnosis not present

## 2024-01-31 DIAGNOSIS — Z86718 Personal history of other venous thrombosis and embolism: Secondary | ICD-10-CM

## 2024-01-31 DIAGNOSIS — R531 Weakness: Secondary | ICD-10-CM

## 2024-01-31 DIAGNOSIS — F1290 Cannabis use, unspecified, uncomplicated: Secondary | ICD-10-CM

## 2024-01-31 DIAGNOSIS — R42 Dizziness and giddiness: Principal | ICD-10-CM

## 2024-01-31 DIAGNOSIS — K219 Gastro-esophageal reflux disease without esophagitis: Secondary | ICD-10-CM

## 2024-01-31 DIAGNOSIS — G319 Degenerative disease of nervous system, unspecified: Secondary | ICD-10-CM | POA: Diagnosis not present

## 2024-01-31 LAB — COMPREHENSIVE METABOLIC PANEL WITH GFR
ALT: 15 U/L (ref 0–44)
AST: 23 U/L (ref 15–41)
Albumin: 4.1 g/dL (ref 3.5–5.0)
Alkaline Phosphatase: 46 U/L (ref 38–126)
Anion gap: 9 (ref 5–15)
BUN: 31 mg/dL — ABNORMAL HIGH (ref 8–23)
CO2: 23 mmol/L (ref 22–32)
Calcium: 9.3 mg/dL (ref 8.9–10.3)
Chloride: 108 mmol/L (ref 98–111)
Creatinine, Ser: 1.07 mg/dL (ref 0.61–1.24)
GFR, Estimated: >60 mL/min (ref >60)
Glucose, Bld: 110 mg/dL — ABNORMAL HIGH (ref 70–99)
Potassium: 4 mmol/L (ref 3.5–5.1)
Sodium: 140 mmol/L (ref 135–145)
Total Bilirubin: 0.6 mg/dL (ref 0.0–1.2)
Total Protein: 7 g/dL (ref 6.5–8.1)

## 2024-01-31 LAB — ETHANOL: Alcohol, Ethyl (B): <15 mg/dL (ref <15)

## 2024-01-31 LAB — TROPONIN I (HIGH SENSITIVITY)
Troponin I (High Sensitivity): 10 ng/L (ref <18)
Troponin I (High Sensitivity): 10 ng/L (ref <18)

## 2024-01-31 LAB — URINALYSIS, ROUTINE W REFLEX MICROSCOPIC
Bacteria, UA: NONE SEEN
Bilirubin Urine: NEGATIVE
Glucose, UA: NEGATIVE mg/dL
Ketones, ur: NEGATIVE mg/dL
Leukocytes,Ua: NEGATIVE
Nitrite: NEGATIVE
Protein, ur: NEGATIVE mg/dL
RBC / HPF: >50 RBC/hpf (ref 0–5)
Specific Gravity, Urine: 1.01 (ref 1.005–1.030)
pH: 6 (ref 5.0–8.0)

## 2024-01-31 LAB — RAPID URINE DRUG SCREEN, HOSP PERFORMED
Amphetamines: NOT DETECTED
Barbiturates: NOT DETECTED
Benzodiazepines: NOT DETECTED
Cocaine: NOT DETECTED
Opiates: NOT DETECTED
Tetrahydrocannabinol: POSITIVE — AB

## 2024-01-31 LAB — CBC
HCT: 43.3 % (ref 39.0–52.0)
Hemoglobin: 14 g/dL (ref 13.0–17.0)
MCH: 29.2 pg (ref 26.0–34.0)
MCHC: 32.3 g/dL (ref 30.0–36.0)
MCV: 90.2 fL (ref 80.0–100.0)
Platelets: 204 10*3/uL (ref 150–400)
RBC: 4.8 MIL/uL (ref 4.22–5.81)
RDW: 15.1 % (ref 11.5–15.5)
WBC: 9.7 10*3/uL (ref 4.0–10.5)
nRBC: 0 % (ref 0.0–0.2)

## 2024-01-31 LAB — PROTIME-INR
INR: 1.2 (ref 0.8–1.2)
Prothrombin Time: 15 s (ref 11.4–15.2)

## 2024-01-31 LAB — TSH: TSH: 0.584 u[IU]/mL (ref 0.350–4.500)

## 2024-01-31 MED ORDER — MECLIZINE HCL 25 MG PO TABS
25.0000 mg | ORAL_TABLET | Freq: Once | ORAL | Status: AC
  Administered 2024-01-31: 25 mg via ORAL
  Filled 2024-01-31: qty 1

## 2024-01-31 MED ORDER — ONDANSETRON HCL 4 MG/2ML IJ SOLN
4.0000 mg | Freq: Four times a day (QID) | INTRAMUSCULAR | Status: DC | PRN
Start: 2024-01-31 — End: 2024-02-01

## 2024-01-31 MED ORDER — METOPROLOL TARTRATE 25 MG PO TABS
25.0000 mg | ORAL_TABLET | Freq: Once | ORAL | Status: AC
  Administered 2024-01-31: 25 mg via ORAL
  Filled 2024-01-31: qty 1

## 2024-01-31 MED ORDER — APIXABAN 5 MG PO TABS
5.0000 mg | ORAL_TABLET | Freq: Two times a day (BID) | ORAL | Status: DC
  Administered 2024-01-31: 5 mg via ORAL
  Filled 2024-01-31: qty 1

## 2024-01-31 MED ORDER — METOPROLOL TARTRATE 25 MG PO TABS
25.0000 mg | ORAL_TABLET | Freq: Two times a day (BID) | ORAL | Status: DC
  Administered 2024-01-31: 25 mg via ORAL
  Filled 2024-01-31: qty 1

## 2024-01-31 MED ORDER — ACETAMINOPHEN 325 MG PO TABS: 650.0000 mg | ORAL_TABLET | Freq: Four times a day (QID) | ORAL | Status: DC | PRN

## 2024-01-31 MED ORDER — SODIUM CHLORIDE 0.9 % IV SOLN: INTRAVENOUS | Status: AC

## 2024-01-31 MED ORDER — ALBUTEROL SULFATE (2.5 MG/3ML) 0.083% IN NEBU: 2.5000 mg | INHALATION_SOLUTION | Freq: Four times a day (QID) | RESPIRATORY_TRACT | Status: DC | PRN

## 2024-01-31 MED ORDER — APIXABAN 5 MG PO TABS
5.0000 mg | ORAL_TABLET | Freq: Once | ORAL | Status: AC
  Administered 2024-01-31: 5 mg via ORAL
  Filled 2024-01-31: qty 1

## 2024-01-31 MED ORDER — TRIAMCINOLONE ACETONIDE 0.1 % EX OINT: 1.0000 | TOPICAL_OINTMENT | CUTANEOUS | Status: DC | PRN

## 2024-01-31 MED ORDER — SODIUM CHLORIDE 0.9% FLUSH
3.0000 mL | Freq: Two times a day (BID) | INTRAVENOUS | Status: DC
  Administered 2024-01-31: 3 mL via INTRAVENOUS

## 2024-01-31 MED ORDER — HYDRALAZINE HCL 20 MG/ML IJ SOLN: 10.0000 mg | INTRAMUSCULAR | Status: DC | PRN

## 2024-01-31 MED ORDER — DIPHENHYDRAMINE HCL 25 MG PO CAPS: 25.0000 mg | ORAL_CAPSULE | Freq: Every evening | ORAL | Status: DC | PRN

## 2024-01-31 MED ORDER — ACETAMINOPHEN 650 MG RE SUPP: 650.0000 mg | Freq: Four times a day (QID) | RECTAL | Status: DC | PRN

## 2024-01-31 MED ORDER — PANTOPRAZOLE SODIUM 40 MG PO TBEC: 40.0000 mg | DELAYED_RELEASE_TABLET | Freq: Every day | ORAL | Status: DC | PRN

## 2024-01-31 MED ORDER — ONDANSETRON HCL 4 MG PO TABS: 4.0000 mg | ORAL_TABLET | Freq: Four times a day (QID) | ORAL | Status: DC | PRN

## 2024-01-31 MED ORDER — SODIUM CHLORIDE 0.9 % IV BOLUS (SEPSIS)
1000.0000 mL | Freq: Once | INTRAVENOUS | Status: AC
  Administered 2024-01-31: 1000 mL via INTRAVENOUS

## 2024-01-31 NOTE — H&P (Signed)
 History and Physical    Patient: David [name] RJJ:[id number] DOB: Sep 22, 1935 DOA: 01/30/2024 DOS: the patient was seen and examined on 01/31/2024 PCP: Pcp, No  Patient coming from: Home  Chief Complaint:  Chief Complaint  Patient presents with   Weakness   Dizziness   HPI: David [name] is a 88 y.o. male with medical history significant of hypertension, DVT/PE on Eliquis , COPD, nephrolithiasis,presents with dizziness and inability to stand. He is accompanied by his daughter.  He experiences dizziness and an inability to stand, describing the sensation as feeling like he was going to fall. No sensation of the room spinning or feeling like he could pass out. No chest pain, arm pain, neck pain, or back pain. He feels weak during the dizziness, with the weakness affecting his entire body rather than one side.  There have been no recent changes in his medications, which include hydrochlorothiazide and metoprolol , both of which he has been taking for over a year. He was outside working in the yard and was not drinking a lot of fluids at the time of the incident. His daughter notes that whenever his diuretic is stopped, he experiences swelling in his legs.  He has a history of blood clots in his legs and one in his lung six months ago, for which he is on Eliquis . No chest discomfort, shortness of breath, cough, or fever.  Denied having any focal weakness or vision changes.  He does not use alcohol or tobacco currently, although he did smoke years ago.  In the emergency department patient was noted to be afebrile with blood pressures elevated up to 200/91.  Labs obtained 6/5 significant for BUN elevated at 31 with creatinine 1.07, and all other labs relatively within normal limits. Chest x-ray noted progressive aneurysmal thoracic aorta that was partially artifactual. CT of the brain did not reveal any acute abnormality, 8 mm dense calcified left parafalcine meningioma without associated mass  effect, and chronic left frontal sinusitis.  MRI of the brain revealed no acute intercranial abnormality.  Patient had been given 1 L of normal saline IV fluids, meclizine 25 mg p.o., metoprolol  25 mg p.o.Aaron Aas  There have been report of some marijuana brownies that family was concerned that the patient may have gotten a hold of which may have led to his presentation to the hospital.  Patient denied consuming any of these brownies.  However UDS screen was noted to be positive for THC.   Review of Systems: As mentioned in the history of present illness. All other systems reviewed and are negative. Past Medical History:  Diagnosis Date   Bell's palsy    COPD (chronic obstructive pulmonary disease) (HCC) 08/28/2023   Kidney stones    UTI (urinary tract infection)    Past Surgical History:  Procedure Laterality Date   TONSILLECTOMY     Social History:  reports that he has never smoked. He has never used smokeless tobacco. He reports that he does not drink alcohol and does not use drugs.  No Known Allergies  Family History  Problem Relation Age of Onset   Heart disease Brother    Liver disease Neg Hx    Esophageal cancer Neg Hx    Colon cancer Neg Hx     Prior to Admission medications   Medication Sig Start Date End Date Taking? Authorizing Provider  apixaban  (ELIQUIS ) 5 MG TABS tablet Take 1 tablet (5 mg total) by mouth 2 (two) times daily. 09/30/23  Yes Faye Hoops B, RPH-CPP  diphenhydrAMINE HCl (BENADRYL ALLERGY PO) Take 1 tablet by mouth at bedtime as needed (itching).   Yes [provider]  furosemide  (LASIX ) 20 MG tablet Take 1 tablet (20 mg total) by mouth daily. 11/28/23  Yes Tolia, Sunit, DO  metoprolol  tartrate (LOPRESSOR ) 25 MG tablet Take 1 tablet (25 mg total) by mouth 3 (three) times daily. HOLD if systolic blood pressure (top number) is less than 100 and/or if heart rate less than 55 12/13/23  Yes Tolia, Sunit, DO  omeprazole  (PRILOSEC) 20 MG capsule Take 1 capsule (20  mg total) by mouth 2 (two) times daily before a meal. Patient taking differently: Take 20 mg by mouth as needed (heartburn and GERD). 09/24/22  Yes Early, Sara E, NP  triamcinolone  ointment (KENALOG ) 0.1 % Apply 1 Application topically 2 (two) times daily to affected areas. Patient not taking: Reported on 01/31/2024 12/16/23   Nonda Bays, FNP    Physical Exam: Vitals:   01/31/24 0330 01/31/24 0430 01/31/24 0500 01/31/24 0522  BP: 120/73 128/72 (!) 125/53   Pulse: (!) 43 (!) 35 77   Resp: (!) 23 (!) 30 20   Temp:    (!) 97.5 F (36.4 C)  TempSrc:    Oral  SpO2: 97% 96% 97%   Weight:      Height:       Constitutional: Elderly male currently in no acute distress Eyes: PERRL, left eyelid lag secondary to history of Bell's palsy ENMT: Mucous membranes are poor dentition.  Left-sided facial droop Neck: normal, supple  Respiratory: clear to auscultation bilaterally, no wheezing, no crackles. Normal respiratory effort. No accessory muscle use.  Cardiovascular: Bradycardic.  No extremity edema. 2+ pedal pulses.   Abdomen: no tenderness, no masses palpated. Bowel sounds positive.  Musculoskeletal: no clubbing / cyanosis. No joint deformity upper and lower extremities. Good ROM, no contractures. Normal muscle tone.  Skin: no rashes, lesions, ulcers. No induration Neurologic: CN 2-12 grossly intact.  Strength 5/5 in all 4.  Psychiatric: Normal judgment and insight. Alert and oriented x 3. Normal mood.   Data Reviewed:  eviewed labs, imaging, and pertinent records as documented.  Assessment and Plan:  Dizziness and weakness Patient presented with complaints of dizziness and weakness after being outside in the heat and drinking minimal water.  He reported inability to get up off the couch and normally is able to ambulate without assistance.  His daughter noted that his speech was slightly slurred as well.  He is on a diuretic for lower extremity swelling as reported by his daughter.   Labs noted elevated BUN to creatinine ratio suggestive of UDS positive for marijuana.  Family notes that there had been THC brownies in the house that the patient likely ate unknowingly.  MRI of the brain was negative for any acute abnormality.  Patient had been given 1 L of normal saline IV fluids with improvement in symptoms.  Other factors include marijuana being positive on UDS.  Suspect symptoms secondary to dehydration +/- marijuana on the differential included the possibility of TIA. - Admit to a telemetry bed - Up with assistance - Check orthostatic vital signs - Held hydrochlorothiazide - Normal saline IV fluids at 75 mL/h x 1 additional liter - PT/OT to evaluate and treat  Hypertensive urgency Blood pressures were initially elevated up to 200/91. - Continue metoprolol  with holding parameters for heart rate less than 60 or systolic blood pressures less than 100. - Held furosemide  - Hydralazine IV as needed for elevated blood  pressures  History of DVT/PE on chronic anticoagulation Patient previously noted to have extensive bilateral pulmonary emboli in the left lower extremity DVT when hospitalized 08/27/2023. - Continue Eliquis   Marijuana use Urine drug screen was positive for marijuana.  Family notes that patient likely ate [employer] unknowingly.  GERD - Continue pharmacy substitution of Protonix  for omeprazole    DVT prophylaxis: Lovenox Advance Care Planning:   Code Status: Full Code   Consults: None  Family Communication: Family updated at bedside  Severity of Illness: The appropriate patient status for this patient is OBSERVATION. Observation status is judged to be reasonable and necessary in order to provide the required intensity of service to ensure the patient's safety. The patient's presenting symptoms, physical exam findings, and initial radiographic and laboratory data in the context of their medical condition is felt to place them at decreased risk for further  clinical deterioration. Furthermore, it is anticipated that the patient will be medically stable for discharge from the hospital within 2 midnights of admission.   Author: Lena Qualia, MD 01/31/2024 7:40 AM  For on call review [email or web address].

## 2024-01-31 NOTE — Progress Notes (Signed)
 Transition of Care [facility]) - Inpatient Brief Assessment   Patient Details  Name: David [name] MRN: [id number] Date of Birth: April 30, 1936  [facility] CM/SW Contact:    Dane Dung, RN Phone Number: 01/31/2024, 4:28 PM   Clinical Narrative: CM met with the patient and daughter at the bedside to discuss TOC needs.  [place] letter provided to the patient at the bedside.  Patient lives at home alone and plans to return home when medically stable.  Patient normally ambulated without DME.  Patient/ daughter was provided Medicare choice regarding home health and daughter did not have a preference.  HH order for PT ordered to be co-signed by MD.  I called Randel Buss, RNCM with [facility] and accepted for [facility] PT.  Patient should discharge home by car when medically stable.   Transition of Care Asessment: Insurance and Status: (P) Insurance coverage has been reviewed Patient has primary care physician: (P) Yes ([facility]) Home environment has been reviewed: (P) from home alone Prior level of function:: (P) independent Prior/Current Home Services: (P) No current home services Social Drivers of Health Review: (P) SDOH reviewed interventions complete Readmission risk has been reviewed: (P) Yes Transition of care needs: (P) transition of care needs identified, TOC will continue to follow

## 2024-01-31 NOTE — ED Provider Notes (Signed)
 [place] EMERGENCY DEPARTMENT AT [facility] Provider Note   CSN: [id number] Arrival date & time: 01/30/24  2324     History  Chief Complaint  Patient presents with   Weakness   Dizziness    David [name] is a 88 y.o. male.  The history is provided by the patient and a relative.  Dizziness Associated symptoms: weakness   Associated symptoms: no chest pain, no diarrhea, no headaches, no shortness of breath and no vomiting   Patient with history of chronic Bell's palsy with left facial weakness, COPD, VTE on Eliquis  presents with generalized weakness and dizziness Patient reports over the past day he has had increasing dizziness with walking around.  No syncope or falls.  Denies any fever/vomiting/diarrhea.  No chest pain or shortness of breath.  No focal weakness.  Daughter reports that she is unsure of time that this started, but earlier in the day he was walking around doing work at home.  She noted that he was having slurred speech on the phone but that has resolved He is usually able to walk without difficulty.  No change in medications.    Past Medical History:  Diagnosis Date   Bell's palsy    COPD (chronic obstructive pulmonary disease) (HCC) 08/28/2023   Kidney stones    UTI (urinary tract infection)     Home Medications Prior to Admission medications   Medication Sig Start Date End Date Taking? Authorizing Provider  apixaban  (ELIQUIS ) 5 MG TABS tablet Take 1 tablet (5 mg total) by mouth 2 (two) times daily. 09/30/23   Yates, Madison B, RPH-CPP  diphenhydrAMINE HCl (BENADRYL ALLERGY PO) Take 1 tablet by mouth at bedtime as needed (itching).    [provider]  furosemide  (LASIX ) 20 MG tablet Take 1 tablet (20 mg total) by mouth daily. 11/28/23   Tolia, Sunit, DO  metoprolol  tartrate (LOPRESSOR ) 25 MG tablet Take 1 tablet (25 mg total) by mouth 3 (three) times daily. HOLD if systolic blood pressure (top number) is less than 100 and/or if heart rate  less than 55 12/13/23   Tolia, Sunit, DO  omeprazole  (PRILOSEC) 20 MG capsule Take 1 capsule (20 mg total) by mouth 2 (two) times daily before a meal. Patient taking differently: Take 20 mg by mouth as needed (heartburn and GERD). 09/24/22   Early, Sara E, NP  triamcinolone  ointment (KENALOG ) 0.1 % Apply 1 Application topically 2 (two) times daily to affected areas. 12/16/23   Nonda Bays, FNP      Allergies    Patient has no known allergies.    Review of Systems   Review of Systems  Constitutional:  Negative for fever.  Eyes:  Negative for visual disturbance.  Respiratory:  Negative for shortness of breath.   Cardiovascular:  Negative for chest pain.  Gastrointestinal:  Negative for abdominal pain, diarrhea and vomiting.  Neurological:  Positive for dizziness and weakness. Negative for headaches.    Physical Exam Updated Vital Signs BP 120/73   Pulse (!) 43   Temp 98.4 F (36.9 C) (Rectal)   Resp (!) 23   Ht 1.651 m (5\' 5" )   Wt 60.7 kg   SpO2 97%   BMI 22.27 kg/m  Physical Exam CONSTITUTIONAL: Elderly and mildly disheveled HEAD: Normocephalic/atraumatic EYES: EOMI/PERRL, no nystagmus, chronic ptosis noted to the left eye ENMT: Mucous membranes moist NECK: supple no meningeal signs CV: S1/S2 noted, no murmurs/rubs/gallops noted LUNGS: Lungs are clear to auscultation bilaterally, no apparent distress ABDOMEN:  soft, nontender, no rebound or guarding GU:no cva tenderness NEURO:Awake/alert, face symmetric, no arm or leg drift is noted Equal 5/5 strength with shoulder abduction, elbow flex/extension, wrist flex/extension in upper extremities and equal hand grips bilaterally Equal 5/5 strength with hip flexion,knee flex/extension, foot dorsi/plantar flexion Cranial nerves 3/4/5/6/03/04/09/11/12 tested and intact Gait normal without ataxia No past pointing Sensation to light touch intact in all extremities EXTREMITIES: pulses normalx4, full ROM, no deformities SKIN:  warm, color normal PSYCH: no abnormalities of mood noted  ED Results / Procedures / Treatments   Labs (all labs ordered are listed, but only abnormal results are displayed) Labs Reviewed  COMPREHENSIVE METABOLIC PANEL WITH GFR - Abnormal; Notable for the following components:      Result Value   Glucose, Bld 110 (*)    BUN 31 (*)    All other components within normal limits  URINALYSIS, ROUTINE W REFLEX MICROSCOPIC - Abnormal; Notable for the following components:   Hgb urine dipstick LARGE (*)    All other components within normal limits  RAPID URINE DRUG SCREEN, HOSP PERFORMED - Abnormal; Notable for the following components:   Tetrahydrocannabinol POSITIVE (*)    All other components within normal limits  CBC  PROTIME-INR  ETHANOL  CBG MONITORING, ED  TROPONIN I (HIGH SENSITIVITY)  TROPONIN I (HIGH SENSITIVITY)    EKG EKG Interpretation Date/Time:  Friday January 31 2024 00:59:21 EDT Ventricular Rate:  77 PR Interval:  211 QRS Duration:  154 QT Interval:  408 QTC Calculation: 462 R Axis:   39  Text Interpretation: Sinus rhythm Multiple ventricular premature complexes Probable left atrial enlargement Right bundle branch block Confirmed by Eldon Greenland ([id number]) on 01/31/2024 1:10:19 AM  Radiology MR BRAIN WO CONTRAST Result Date: 01/31/2024 CLINICAL DATA:  Syncope/presyncope, cerebrovascular cause suspected EXAM: MRI HEAD WITHOUT CONTRAST TECHNIQUE: Multiplanar, multiecho pulse sequences of the brain and surrounding structures were obtained without intravenous contrast. COMPARISON:  CT head from the same day. FINDINGS: Brain: No acute infarction, hemorrhage, hydrocephalus, extra-axial collection or mass lesion. Cerebral atrophy. Moderate T2/FLAIR hyperintensities in the white matter, compatible with chronic microvascular disease. Vascular: Major arterial flow voids are maintained at the skull base. Skull and upper cervical spine: Normal marrow signal. Sinuses/Orbits: Clear  sinuses.  No acute orbital findings. Other: No mastoid effusions. IMPRESSION: No evidence of acute intracranial abnormality. Electronically Signed   By: Stevenson Elbe M.D.   On: 01/31/2024 03:03   CT HEAD WO CONTRAST Result Date: 01/31/2024 CLINICAL DATA:  Central vertigo EXAM: CT HEAD WITHOUT CONTRAST TECHNIQUE: Contiguous axial images were obtained from the base of the skull through the vertex without intravenous contrast. RADIATION DOSE REDUCTION: This exam was performed according to the departmental dose-optimization program which includes automated exposure control, adjustment of the mA and/or kV according to patient size and/or use of iterative reconstruction technique. COMPARISON:  None Available. FINDINGS: Brain: Normal anatomic configuration. Parenchymal volume loss is commensurate with the patient's age. Moderate periventricular white matter changes are present likely reflecting the sequela of small vessel ischemia. 8 mm densely calcified left parafalcine meningioma noted without significant associated mass effect. No abnormal intra or extra-axial fluid collection. No abnormal mass effect or midline shift. No evidence of acute intracranial hemorrhage or infarct. Ventricular size is normal. Cerebellum unremarkable. Vascular: No asymmetric hyperdense vasculature at the skull base. Skull: Intact Sinuses/Orbits: Densely opacified left frontal sinus with thickening of the sinus walls in keeping with changes of chronic sinusitis. Remaining paranasal sinuses are clear. Orbits are unremarkable. Other: Mastoid  air cells and middle ear cavities are clear. IMPRESSION: 1. No acute intracranial abnormality. 2. Moderate periventricular white matter changes likely reflecting the sequela of small vessel ischemia. 3. 8 mm densely calcified left parafalcine meningioma without significant associated mass effect. 4. Chronic left frontal sinusitis. Electronically Signed   By: Worthy Heads M.D.   On: 01/31/2024 00:25    DG Chest 2 View Result Date: 01/31/2024 CLINICAL DATA:  Weakness 08/26/2023 EXAM: CHEST - 2 VIEW COMPARISON:  08/26/2023 FINDINGS: There is increasing with kyphotic positioning for this examination, however, the thoracic aorta appears progressively aneurysmal. Cardiac size at the upper limits of normal. Lungs are clear. No pneumothorax or pleural effusion. Stable midthoracic compression deformities. No acute bone abnormality. IMPRESSION: 1. Progressively aneurysmal thoracic aorta, partially artifactual. This could be better assessed with repeat standard two view chest radiograph with close retention of patient positioning or CT imaging. Electronically Signed   By: Worthy Heads M.D.   On: 01/31/2024 00:21    Procedures Procedures    Medications Ordered in ED Medications  apixaban  (ELIQUIS ) tablet 5 mg (has no administration in time range)  metoprolol  tartrate (LOPRESSOR ) tablet 25 mg (has no administration in time range)  sodium chloride  0.9 % bolus 1,000 mL (0 mLs Intravenous Stopped 01/31/24 0222)  meclizine (ANTIVERT) tablet 25 mg (25 mg Oral Given 01/31/24 0221)    ED Course/ Medical Decision Making/ A&P Clinical Course as of 01/31/24 0502  Fri Jan 31, 2024  0050 Patient presents for weakness and dizziness of unclear etiology. He has no focal weakness, no facial droop. He has chronic weakness of the left eyelid due to previous Bell's palsy that is unchanged There is no slurred speech at this time No acute findings on CT head.  Workup pending at this time [DW]  0109 Chest x-ray and CT head was likely chronic findings. No signs of symptoms of dissection at this time [DW]  0212 Patient still reporting dizziness with any movement.  He had difficulty even standing up for orthostatic blood pressure.  However while in bed he has no focal neurodeficits and no dizziness  Also exact last known well time is unknown  Plan to obtain MRI.  Will give a dose of Antivert [DW]  0451 MRI is negative.   Urinalysis negative except for hematuria Patient was given meclizine.  Another attempt at ambulation revealed he was still reporting vertiginous symptoms and difficulty walking. [DW]  [phone] Patient is high risk for falls as he will on anticoagulation, he will be admitted [DW]  0502 Discussed with Dr. Moishe Angel for admission  [DW]    Clinical Course User Index [DW] Eldon Greenland, MD                                 Medical Decision Making Amount and/or Complexity of Data Reviewed Labs: ordered. Radiology: ordered. ECG/medicine tests: ordered.  Risk Prescription drug management. Decision regarding hospitalization.   This patient presents to the ED for concern of weakness and dizziness, this involves an extensive number of treatment options, and is a complaint that carries with it a high risk of complications and morbidity.  The differential diagnosis includes but is not limited to CVA, intracranial hemorrhage, acute coronary syndrome, renal failure, urinary tract infection, electrolyte disturbance, pneumonia    Comorbidities that complicate the patient evaluation: Patient's presentation is complicated by their history of VTE  Social Determinants of Health: Patient's poor health literacy  increases the complexity of managing their presentation  Additional history obtained: Additional history obtained from family Records reviewed cardiology notes  Lab Tests: I Ordered, and personally interpreted labs.  The pertinent results include: Dehydration noted  Imaging Studies ordered: I ordered imaging studies including CT scan head and X-ray chest  I independently visualized and interpreted imaging which showed no acute findings I agree with the radiologist interpretation  Cardiac Monitoring: The patient was maintained on a cardiac monitor.  I personally viewed and interpreted the cardiac monitor which showed an underlying rhythm of:  sinus rhythm  Medicines ordered and prescription  drug management: I ordered medication including IV fluids  for dehydration  Reevaluation of the patient after these medicines showed that the patient    stayed the same   Critical Interventions:  IV fluids, monitoring  Consultations Obtained: I requested consultation with the admitting physician triad, and discussed  findings as well as pertinent plan - they recommend: admit  Reevaluation: After the interventions noted above, I reevaluated the patient and found that they have :stayed the same  Complexity of problems addressed: Patient's presentation is most consistent with  acute presentation with potential threat to life or bodily function  Disposition: After consideration of the diagnostic results and the patient's response to treatment,  I feel that the patent would benefit from admission  .           Final Clinical Impression(s) / ED Diagnoses Final diagnoses:  Vertigo  Dehydration    Rx / DC Orders ED Discharge Orders     None         Eldon Greenland, MD 01/31/24 [phone]

## 2024-01-31 NOTE — Evaluation (Addendum)
 Physical Therapy Evaluation Patient Details Name: David [name] MRN: [id number] DOB: 12-21-1935 Today's Date: 01/31/2024  History of Present Illness  Patient is an 88 y/o male admitted 01/30/24 with dizziness, weakness.  CTH and MRI negative for acute changes. PMH positive for Bell's palsy, COPD, kidney stones, PVC's with RBBB and UTI.  Clinical Impression  Patient presents with decreased mobility though reports able to walk in the room first time since yesterday (using walker) without symptoms.  Patient noted to have some L eye dysconjugate gaze (lateral compared to R) though states is since his Bell's Palsy.  Also has decreased hearing in that ear. Seems to have resolved in terms of dizziness (imbalance) symptoms with vestibular testing positive only to difficulty with horizontal VOR some due to difficulty with command following and due to stiffness in his neck.  Potential for R side vestibular hypofunction though would need further testing.  Patient and daughter educated on importance of hydration and encouraged to ask staff for more ambulation.  Will follow up if remains impatient and will see if mobility can see him today.      Vestibular Assessment - 01/31/24 1516       Symptom Behavior   Subjective history of current problem reports dizziness that started last night when working on picking up his tomato plants    Type of Dizziness  Imbalance    Frequency of Dizziness intermittent    Duration of Dizziness minutes    Symptom Nature Variable;Intermittent;Motion provoked    Aggravating Factors Supine to sit;Sit to stand    Relieving Factors Head stationary;Lying supine    Progression of Symptoms Better    History of similar episodes patient reports none, though daughter reports when he calls her and reports dizziness she asks him about pain, weakness, etc then asks him to drink three water bottles and he feels better when he does.      Oculomotor Exam   Oculomotor Alignment Abnormal   L eye  lateral to midline and ptosis   Ocular ROM Limited L eye stays lateral compared to R with ROM    Spontaneous Absent    Gaze-induced  Absent    Head shaking Horizontal Absent    Smooth Pursuits Saccades    Saccades Intact      Oculomotor Exam-Fixation Suppressed    Left Head Impulse difficult to perform pt with stiffness in neck though even with attempts at VOR loses target with L head turns    Right Head Impulse negative      Vestibulo-Ocular Reflex   VOR 1 Head Only (x 1 viewing) Intact without difficulty vertical head nods, difficulty with head rotation Lt to R with pain in neck and some difficulty following commands    VOR Cancellation Normal      Auditory   Comments decreased to scratch test on L compared to R pt feels is not new      Positional Testing   Sidelying Test Sidelying Right;Sidelying Left    Horizontal Canal Testing Horizontal Canal Right;Horizontal Canal Left      Sidelying Right   Sidelying Right Duration 1 minute    Sidelying Right Symptoms No nystagmus      Sidelying Left   Sidelying Left Duration 1 minute    Sidelying Left Symptoms No nystagmus      Horizontal Canal Right   Horizontal Canal Right Duration 1 minute    Horizontal Canal Right Symptoms Normal      Horizontal Canal Left   Horizontal Canal  Left Duration 1 minute    Horizontal Canal Left Symptoms Normal                If plan is discharge home, recommend the following: A little help with walking and/or transfers;Assistance with cooking/housework;Help with stairs or ramp for entrance;A lot of help with bathing/dressing/bathroom   Can travel by private vehicle        Equipment Recommendations BSC/3in1 (for shower)  Recommendations for Other Services       Functional Status Assessment Patient has had a recent decline in their functional status and demonstrates the ability to make significant improvements in function in a reasonable and predictable amount of time.     Precautions  / Restrictions Precautions Precautions: Fall      Mobility  Bed Mobility Overal bed mobility: Needs Assistance Bed Mobility: Supine to Sit, Sit to Supine     Supine to sit: Supervision Sit to supine: Min assist   General bed mobility comments: supervision to sit up then assist for positioning for testing for positional vertigo    Transfers Overall transfer level: Needs assistance Equipment used: Rolling walker (2 wheels) Transfers: Sit to/from Stand Sit to Stand: Contact guard assist           General transfer comment: initial assist and cues for hand placement, seems comfortable so less assist for stand to sit    Ambulation/Gait Ambulation/Gait assistance: Contact guard assist, Supervision Gait Distance (Feet): 30 Feet Assistive device: Rolling walker (2 wheels) Gait Pattern/deviations: Step-through pattern, Decreased stride length       General Gait Details: slow pace, cues for slow turns to avoid exacerbating dizziness though pt continued to report no symptoms  Stairs            Wheelchair Mobility     Tilt Bed    Modified Rankin (Stroke Patients Only)       Balance Overall balance assessment: Needs assistance   Sitting balance-Leahy Scale: Good     Standing balance support: Bilateral upper extremity supported, Reliant on assistive device for balance Standing balance-Leahy Scale: Poor Standing balance comment: A for balance due to recent dizziness/imbalance                             Pertinent Vitals/Pain Pain Assessment Pain Assessment: No/denies pain    Home Living Family/patient expects to be discharged to:: Private residence Living Arrangements: Other relatives (great neice and her daughter) Available Help at Discharge: Family Type of Home: House Home Access: Stairs to enter   Secretary/administrator of Steps: 1   Home Layout: One level Home Equipment: Grab bars - tub/shower;Cane - single Librarian, academic (2  wheels)      Prior Function Prior Level of Function : Independent/Modified Independent             Mobility Comments: works in the garden; cooks, Lexicographer, manages own medications       Extremity/Trunk Assessment   Upper Extremity Assessment Upper Extremity Assessment: Overall WFL for tasks assessed    Lower Extremity Assessment Lower Extremity Assessment: Overall WFL for tasks assessed    Cervical / Trunk Assessment Cervical / Trunk Assessment: Kyphotic;Other exceptions Cervical / Trunk Exceptions: forward head  Communication   Communication Factors Affecting Communication: Hearing impaired (decreased hearing L ear since Bell's Palsy)    Cognition Arousal: Alert Behavior During Therapy: WFL for tasks assessed/performed   PT - Cognitive impairments: Problem solving, Safety/Judgement  PT - Cognition Comments: A&O x 4 though daughter asking about if testing can determine dementia.  States pt's mother had it and she notices poor frustration tolerance and occasional agitation.         Cueing       General Comments General comments (skin integrity, edema, etc.): completed vestibular testing and education on importance of hydration    Exercises     Assessment/Plan    PT Assessment Patient needs continued PT services  PT Problem List Decreased balance;Decreased knowledge of precautions;Decreased knowledge of use of DME;Decreased activity tolerance       PT Treatment Interventions DME instruction;Functional mobility training;Balance training;Patient/family education;Therapeutic activities;Stair training;Therapeutic exercise    PT Goals (Current goals can be found in the Care Plan section)  Acute Rehab PT Goals Patient Stated Goal: return to independent PT Goal Formulation: With patient/family Time For Goal Achievement: 02/14/24 Potential to Achieve Goals: Good    Frequency Min 2X/week     Co-evaluation                AM-PAC PT "6 Clicks" Mobility  Outcome Measure Help needed turning from your back to your side while in a flat bed without using bedrails?: A Little Help needed moving from lying on your back to sitting on the side of a flat bed without using bedrails?: A Little Help needed moving to and from a bed to a chair (including a wheelchair)?: A Little Help needed standing up from a chair using your arms (e.g., wheelchair or bedside chair)?: A Little Help needed to walk in hospital room?: A Little Help needed climbing 3-5 steps with a railing? : Total 6 Click Score: 16    End of Session Equipment Utilized During Treatment: Gait belt Activity Tolerance: Patient tolerated treatment well Patient left: in bed;with family/visitor present;with call bell/phone within reach   PT Visit Diagnosis: Other abnormalities of gait and mobility (R26.89);Other symptoms and signs involving the nervous system (R29.898)    Time: 1400-1433 PT Time Calculation (min) (ACUTE ONLY): 33 min   Charges:   PT Evaluation $PT Eval Moderate Complexity: 1 Mod PT Treatments $Neuromuscular Re-education: 8-22 mins PT General Charges $$ ACUTE PT VISIT: 1 Visit         Abigail Hoff, PT Acute Rehabilitation Services [phone] 01/31/2024   David [name] 01/31/2024, 3:15 PM

## 2024-01-31 NOTE — Care Management Obs Status (Cosign Needed)
 MEDICARE OBSERVATION STATUS NOTIFICATION   Patient Details  Name: David [name] MRN: [id number] Date of Birth: 1936/03/04   Medicare Observation Status Notification Given:  Yes    Dane Dung, RN 01/31/2024, 4:20 PM

## 2024-01-31 NOTE — Progress Notes (Signed)
 Carry over: Admission request for  Vertigo, generalized weakness.   Vitals:   01/31/24 0100 01/31/24 0200 01/31/24 0315 01/31/24 0330  BP: (!) 200/91 (!) 194/94 (!) 156/92 120/73  Pulse: 73 85 85 (!) 43  Temp:      Resp: 16 17 18  (!) 23  Height:      Weight:      SpO2: 94% 95% 98% 97%  TempSrc:      BMI (Calculated):         Vitals:   01/30/24 2353 01/31/24 0100 01/31/24 0200 01/31/24 0315  BP: (!) 161/77 (!) 200/91 (!) 194/94 (!) 156/92   01/31/24 0330  BP: 120/73   Results for orders placed or performed during the hospital encounter of 01/30/24 (from the past 24 hours)  Comprehensive metabolic panel     Status: Abnormal   Collection Time: 01/30/24 11:40 PM  Result Value Ref Range   Sodium 140 135 - 145 mmol/L   Potassium 4.0 3.5 - 5.1 mmol/L   Chloride 108 98 - 111 mmol/L   CO2 23 22 - 32 mmol/L   Glucose, Bld 110 (H) 70 - 99 mg/dL   BUN 31 (H) 8 - 23 mg/dL   Creatinine, Ser 1.61 0.61 - 1.24 mg/dL   Calcium 9.3 8.9 - 09.6 mg/dL   Total Protein 7.0 6.5 - 8.1 g/dL   Albumin 4.1 3.5 - 5.0 g/dL   AST 23 15 - 41 U/L   ALT 15 0 - 44 U/L   Alkaline Phosphatase 46 38 - 126 U/L   Total Bilirubin 0.6 0.0 - 1.2 mg/dL   GFR, Estimated >04 >54 mL/min   Anion gap 9 5 - 15  CBC     Status: None   Collection Time: 01/30/24 11:40 PM  Result Value Ref Range   WBC 9.7 4.0 - 10.5 K/uL   RBC 4.80 4.22 - 5.81 MIL/uL   Hemoglobin 14.0 13.0 - 17.0 g/dL   HCT 09.8 11.9 - 14.7 %   MCV 90.2 80.0 - 100.0 fL   MCH 29.2 26.0 - 34.0 pg   MCHC 32.3 30.0 - 36.0 g/dL   RDW 82.9 56.2 - 13.0 %   Platelets 204 150 - 400 K/uL   nRBC 0.0 0.0 - 0.2 %  Troponin I (High Sensitivity)     Status: None   Collection Time: 01/30/24 11:40 PM  Result Value Ref Range   Troponin I (High Sensitivity) 10 <18 ng/L  Protime-INR     Status: None   Collection Time: 01/30/24 11:44 PM  Result Value Ref Range   Prothrombin Time 15.0 11.4 - 15.2 seconds   INR 1.2 0.8 - 1.2  Ethanol     Status: None    Collection Time: 01/30/24 11:44 PM  Result Value Ref Range   Alcohol, Ethyl (B) <15 <15 mg/dL  Troponin I (High Sensitivity)     Status: None   Collection Time: 01/31/24  2:16 AM  Result Value Ref Range   Troponin I (High Sensitivity) 10 <18 ng/L  Urinalysis, Routine w reflex microscopic -Urine, Clean Catch     Status: Abnormal   Collection Time: 01/31/24  3:06 AM  Result Value Ref Range   Color, Urine YELLOW YELLOW   APPearance CLEAR CLEAR   Specific Gravity, Urine 1.010 1.005 - 1.030   pH 6.0 5.0 - 8.0   Glucose, UA NEGATIVE NEGATIVE mg/dL   Hgb urine dipstick LARGE (A) NEGATIVE   Bilirubin Urine NEGATIVE NEGATIVE   Ketones, ur NEGATIVE  NEGATIVE mg/dL   Protein, ur NEGATIVE NEGATIVE mg/dL   Nitrite NEGATIVE NEGATIVE   Leukocytes,Ua NEGATIVE NEGATIVE   RBC / HPF >50 0 - 5 RBC/hpf   WBC, UA 6-10 0 - 5 WBC/hpf   Bacteria, UA NONE SEEN NONE SEEN   Squamous Epithelial / HPF 0-5 0 - 5 /HPF   Mucus PRESENT   Urine rapid drug screen (hosp performed)     Status: Abnormal   Collection Time: 01/31/24  3:06 AM  Result Value Ref Range   Opiates NONE DETECTED NONE DETECTED   Cocaine NONE DETECTED NONE DETECTED   Benzodiazepines NONE DETECTED NONE DETECTED   Amphetamines NONE DETECTED NONE DETECTED   Tetrahydrocannabinol POSITIVE (A) NONE DETECTED   Barbiturates NONE DETECTED NONE DETECTED    MRI brain Negative. Added on UDS / ethanol.

## 2024-01-31 NOTE — Progress Notes (Signed)
 Mobility Specialist: Progress Note   01/31/24 1556  Mobility  Activity Ambulated with assistance in hallway  Level of Assistance Contact guard assist, steadying assist  Assistive Device Front wheel walker  Distance Ambulated (ft) 400 ft  Activity Response Tolerated well  Mobility Referral Yes  Mobility visit 1 Mobility  Mobility Specialist Start Time (ACUTE ONLY) 1540  Mobility Specialist Stop Time (ACUTE ONLY) 1550  Mobility Specialist Time Calculation (min) (ACUTE ONLY) 10 min    Pt received in bed, agreeable to mobility session. SV for bed mobility and STS. CG for ambulation. Slightly impulsive at beginning of session, prematurely standing up and trying to ambulated before being properly set up. Denies any dizziness or lightheadedness, no complaints throughout. Min cues required for RW steering. Returned to room without fault. Left in bed with all needs met, call bell in reach. Daughter present.   Deloria Fetch Mobility Specialist Please contact via SecureChat or Rehab office at [phone]

## 2024-02-01 ENCOUNTER — Encounter (HOSPITAL_COMMUNITY): Payer: Self-pay | Admitting: Internal Medicine

## 2024-02-01 DIAGNOSIS — R42 Dizziness and giddiness: Secondary | ICD-10-CM | POA: Diagnosis not present

## 2024-02-01 DIAGNOSIS — I160 Hypertensive urgency: Secondary | ICD-10-CM | POA: Diagnosis not present

## 2024-02-01 DIAGNOSIS — F1290 Cannabis use, unspecified, uncomplicated: Secondary | ICD-10-CM | POA: Diagnosis not present

## 2024-02-01 DIAGNOSIS — R531 Weakness: Secondary | ICD-10-CM | POA: Diagnosis not present

## 2024-02-01 LAB — CBC
HCT: 41 % (ref 39.0–52.0)
Hemoglobin: 13.1 g/dL (ref 13.0–17.0)
MCH: 28.4 pg (ref 26.0–34.0)
MCHC: 32 g/dL (ref 30.0–36.0)
MCV: 88.7 fL (ref 80.0–100.0)
Platelets: 176 10*3/uL (ref 150–400)
RBC: 4.62 MIL/uL (ref 4.22–5.81)
RDW: 15.4 % (ref 11.5–15.5)
WBC: 6.2 10*3/uL (ref 4.0–10.5)
nRBC: 0 % (ref 0.0–0.2)

## 2024-02-01 LAB — BASIC METABOLIC PANEL WITH GFR
Anion gap: 6 (ref 5–15)
BUN: 23 mg/dL (ref 8–23)
CO2: 24 mmol/L (ref 22–32)
Calcium: 8.4 mg/dL — ABNORMAL LOW (ref 8.9–10.3)
Chloride: 112 mmol/L — ABNORMAL HIGH (ref 98–111)
Creatinine, Ser: 1.12 mg/dL (ref 0.61–1.24)
GFR, Estimated: >60 mL/min (ref >60)
Glucose, Bld: 65 mg/dL — ABNORMAL LOW (ref 70–99)
Potassium: 3.7 mmol/L (ref 3.5–5.1)
Sodium: 142 mmol/L (ref 135–145)

## 2024-02-01 NOTE — Assessment & Plan Note (Signed)
 02-01-2024 continue eliquis .

## 2024-02-01 NOTE — Assessment & Plan Note (Signed)
 02-01-2024 accidental.  Met with pt and dtr at bedside. Dtr states that pt's grand-dtr works for cannabis dispensary. She hid some cannabis-brownies in the home. Pt may have accidentally eaten one as there is one missing from grand-dtrs supply.

## 2024-02-01 NOTE — Subjective & Objective (Signed)
 Pt seen and examined. Met with pt and dtr at bedside. Dtr states that pt's grand-dtr works for cannabis dispensary. She hid some cannabis-brownies in the home. Pt may have accidentally eaten one as there is one missing from grand-dtrs supply.  Pt feels much better. He takes lasix  for LE edema. Pt works outside in the yard most days. Does not drink any water while outside. Does not urinate while he is outside.  Pt feels better after IVF overnight. No further dizziness. He is ready to go home.

## 2024-02-01 NOTE — Assessment & Plan Note (Signed)
 02-01-2024 likely a combination of dehydration from diuretic use, sweating while working outside and insufficiency water intake combined with accidental THC use.

## 2024-02-01 NOTE — Hospital Course (Signed)
 HPI: David [name] is a 88 y.o. male with medical history significant of hypertension, DVT/PE on Eliquis , COPD, nephrolithiasis,presents with dizziness and inability to stand. He is accompanied by his daughter.   He experiences dizziness and an inability to stand, describing the sensation as feeling like he was going to fall. No sensation of the room spinning or feeling like he could pass out. No chest pain, arm pain, neck pain, or back pain. He feels weak during the dizziness, with the weakness affecting his entire body rather than one side.   There have been no recent changes in his medications, which include hydrochlorothiazide and metoprolol , both of which he has been taking for over a year. He was outside working in the yard and was not drinking a lot of fluids at the time of the incident. His daughter notes that whenever his diuretic is stopped, he experiences swelling in his legs.   He has a history of blood clots in his legs and one in his lung six months ago, for which he is on Eliquis . No chest discomfort, shortness of breath, cough, or fever.  Denied having any focal weakness or vision changes.   He does not use alcohol or tobacco currently, although he did smoke years ago.   In the emergency department patient was noted to be afebrile with blood pressures elevated up to 200/91.  Labs obtained 6/5 significant for BUN elevated at 31 with creatinine 1.07, and all other labs relatively within normal limits. Chest x-ray noted progressive aneurysmal thoracic aorta that was partially artifactual. CT of the brain did not reveal any acute abnormality, 8 mm dense calcified left parafalcine meningioma without associated mass effect, and chronic left frontal sinusitis.  MRI of the brain revealed no acute intercranial abnormality.  Patient had been given 1 L of normal saline IV fluids, meclizine  25 mg p.o., metoprolol  25 mg p.o.Aaron Aas  There have been report of some marijuana brownies that family was  concerned that the patient may have gotten a hold of which may have led to his presentation to the hospital.  Patient denied consuming any of these brownies.  However UDS screen was noted to be positive for THC.  Significant Events: Admitted 01/30/2024 for dizziness/weakness   Admission Labs: Na 140, K 4.0, CO2 of 23, BUN 31, scr 1.07, glu 110 WBC 9.7, HgB 14, plt 204 UA RBC >50, no bacteria, negative nitrite, negative LE UDS positive for THC TSH 0.584  Admission Imaging Studies: CXR Progressively aneurysmal thoracic aorta, partially artifactual  This could be better assessed with repeat standard two view chest radiograph with close retention of patient positioning or CT imaging. CT head No acute intracranial abnormality. 2. Moderate periventricular white matter changes likely reflecting the sequela of small vessel ischemia. 3. 8 mm densely calcified left parafalcine meningioma without significant associated mass effect. 4. Chronic left frontal sinusitis MRI brain No evidence of acute intracranial abnormality   Significant Labs:   Significant Imaging Studies:   Antibiotic Therapy: Anti-infectives (From admission, onward)    None       Procedures:   Consultants:

## 2024-02-01 NOTE — Assessment & Plan Note (Signed)
 02-01-2024 stable on PPI.

## 2024-02-01 NOTE — Plan of Care (Signed)

## 2024-02-01 NOTE — Assessment & Plan Note (Signed)
 02-01-2024 resolved.

## 2024-02-01 NOTE — Evaluation (Signed)
 Occupational Therapy Evaluation Patient Details Name: David [name] MRN: [id number] DOB: July 08, 1936 Today's Date: 02/01/2024   History of Present Illness   Patient is an 88 y/o male admitted 01/30/24 with dizziness, weakness.  CTH and MRI negative for acute changes. PMH positive for Bell's palsy, COPD, kidney stones, PVC's with RBBB and UTI.     Clinical Impressions Pt reports ind at baseline with ADL/functional mobility, family assists with transportation/IADLs. Pt denies dizziness this session, reports does have some diplopia at baseline from bell's palsy. Pt currently needing up to min A for ADLs, supervision for bed mobility and CGA for transfers without AD. Pt educated on shower seat for home and pt states he might have one, recommend one if pt does not have. Pt presenting with impairments listed below, will follow acutely. Anticipate no OT follow up needs at d/c.     If plan is discharge home, recommend the following:   A little help with walking and/or transfers;A little help with bathing/dressing/bathroom;Assistance with cooking/housework;Help with stairs or ramp for entrance;Direct supervision/assist for medications management;Direct supervision/assist for financial management     Functional Status Assessment   Patient has had a recent decline in their functional status and demonstrates the ability to make significant improvements in function in a reasonable and predictable amount of time.     Equipment Recommendations   Tub/shower seat (if pt does not already have)     Recommendations for Other Services   PT consult     Precautions/Restrictions   Precautions Precautions: Fall Restrictions Weight Bearing Restrictions Per Provider Order: No     Mobility Bed Mobility Overal bed mobility: Needs Assistance Bed Mobility: Supine to Sit, Sit to Supine     Supine to sit: Supervision          Transfers Overall transfer level: Needs assistance Equipment  used: None Transfers: Sit to/from Stand Sit to Stand: Contact guard assist                  Balance Overall balance assessment: Needs assistance Sitting-balance support: Feet supported Sitting balance-Leahy Scale: Good     Standing balance support: Bilateral upper extremity supported, Reliant on assistive device for balance Standing balance-Leahy Scale: Poor Standing balance comment: A for balance due to recent dizziness/imbalance                           ADL either performed or assessed with clinical judgement   ADL Overall ADL's : Needs assistance/impaired Eating/Feeding: Set up;Sitting   Grooming: Set up;Sitting   Upper Body Bathing: Minimal assistance;Sitting;Standing   Lower Body Bathing: Minimal assistance;Sitting/lateral leans;Sit to/from stand   Upper Body Dressing : Minimal assistance;Sitting;Standing   Lower Body Dressing: Minimal assistance;Sit to/from stand;Sitting/lateral leans   Toilet Transfer: Supervision/safety;Ambulation           Functional mobility during ADLs: Contact guard assist       Vision   Vision Assessment?: No apparent visual deficits Additional Comments: reports baseline double vision due to bells palsy     Perception Perception: Not tested       Praxis Praxis: Not tested       Pertinent Vitals/Pain Pain Assessment Pain Assessment: No/denies pain     Extremity/Trunk Assessment Upper Extremity Assessment Upper Extremity Assessment: Generalized weakness   Lower Extremity Assessment Lower Extremity Assessment: Defer to PT evaluation       Communication Communication Factors Affecting Communication: Hearing impaired (decr hearing in L ear due to bell's  palsy)   Cognition Arousal: Alert Behavior During Therapy: WFL for tasks assessed/performed Cognition: No apparent impairments                               Following commands: Intact       Cueing  General Comments   Cueing  Techniques: Verbal cues  VSS   Exercises     Shoulder Instructions      Home Living Family/patient expects to be discharged to:: Private residence Living Arrangements: Other relatives (granddaughter and her daughter) Available Help at Discharge: Family;Available PRN/intermittently Type of Home: House Home Access: Stairs to enter [employer] of Steps: 1   Home Layout: One level     Bathroom Shower/Tub: Producer, television/film/video: Standard     Home Equipment: Grab bars - tub/shower;Cane - single Librarian, academic (2 wheels)          Prior Functioning/Environment Prior Level of Function : Independent/Modified Independent             Mobility Comments: no AD use ADLs Comments: ind; family assists with transportation    OT Problem List: Decreased strength;Decreased range of motion;Decreased activity tolerance;Impaired balance (sitting and/or standing)   OT Treatment/Interventions: Therapeutic exercise;Self-care/ADL training;DME and/or AE instruction;Energy conservation      OT Goals(Current goals can be found in the care plan section)   Acute Rehab OT Goals Patient Stated Goal: none stated OT Goal Formulation: With patient Time For Goal Achievement: 02/15/24 Potential to Achieve Goals: Good ADL Goals Pt Will Perform Upper Body Dressing: Independently;sitting Pt Will Perform Lower Body Dressing: Independently;sitting/lateral leans;sit to/from stand Pt Will Transfer to Toilet: Independently;ambulating;regular height toilet Pt Will Perform Tub/Shower Transfer: Shower transfer;Independently;ambulating   OT Frequency:  Min 1X/week    Co-evaluation              AM-PAC OT "6 Clicks" Daily Activity     Outcome Measure Help from another person eating meals?: None Help from another person taking care of personal grooming?: A Little Help from another person toileting, which includes using toliet, bedpan, or urinal?: A Little Help from  another person bathing (including washing, rinsing, drying)?: A Little Help from another person to put on and taking off regular upper body clothing?: A Little Help from another person to put on and taking off regular lower body clothing?: A Little 6 Click Score: 19   End of Session Nurse Communication: Mobility status  Activity Tolerance: Patient tolerated treatment well Patient left: in bed;with call bell/phone within reach  OT Visit Diagnosis: Unsteadiness on feet (R26.81);Muscle weakness (generalized) (M62.81);Other abnormalities of gait and mobility (R26.89)                Time: 3244-0102 OT Time Calculation (min): 12 min Charges:  OT General Charges $OT Visit: 1 Visit OT Evaluation $OT Eval Low Complexity: 1 Low  Jovan Colligan K, OTD, OTR/L SecureChat Preferred Acute Rehab [phone]   Jonnie Truxillo K Koonce 02/01/2024, 10:34 AM

## 2024-02-01 NOTE — Discharge Summary (Signed)
 [facility] Physician Discharge Summary   Patient name: David [name]  Admit date:     01/30/2024  Discharge date: 02/01/2024  Attending Physician: Lena Qualia [[id number]]  Discharge Physician: Unk Garb   PCP: Pcp, No  Admitted From: Home  Disposition:  Home  Recommendations for Outpatient Follow-up:  Follow up with PCP in 1-2 weeks  Home Health:No Equipment/Devices: None    Discharge Condition:Stable CODE STATUS:FULL Diet recommendation: Heart Healthy Fluid Restriction: None  Hospital Summary: HPI: David [name] is a 88 y.o. male with medical history significant of hypertension, DVT/PE on Eliquis , COPD, nephrolithiasis,presents with dizziness and inability to stand. He is accompanied by his daughter.   He experiences dizziness and an inability to stand, describing the sensation as feeling like he was going to fall. No sensation of the room spinning or feeling like he could pass out. No chest pain, arm pain, neck pain, or back pain. He feels weak during the dizziness, with the weakness affecting his entire body rather than one side.   There have been no recent changes in his medications, which include hydrochlorothiazide and metoprolol , both of which he has been taking for over a year. He was outside working in the yard and was not drinking a lot of fluids at the time of the incident. His daughter notes that whenever his diuretic is stopped, he experiences swelling in his legs.   He has a history of blood clots in his legs and one in his lung six months ago, for which he is on Eliquis . No chest discomfort, shortness of breath, cough, or fever.  Denied having any focal weakness or vision changes.   He does not use alcohol or tobacco currently, although he did smoke years ago.   In the emergency department patient was noted to be afebrile with blood pressures elevated up to 200/91.  Labs obtained 6/5 significant for BUN elevated at 31 with creatinine 1.07, and all  other labs relatively within normal limits. Chest x-ray noted progressive aneurysmal thoracic aorta that was partially artifactual. CT of the brain did not reveal any acute abnormality, 8 mm dense calcified left parafalcine meningioma without associated mass effect, and chronic left frontal sinusitis.  MRI of the brain revealed no acute intercranial abnormality.  Patient had been given 1 L of normal saline IV fluids, meclizine  25 mg p.o., metoprolol  25 mg p.o.Aaron Aas  There have been report of some marijuana brownies that family was concerned that the patient may have gotten a hold of which may have led to his presentation to the hospital.  Patient denied consuming any of these brownies.  However UDS screen was noted to be positive for THC.  Significant Events: Admitted 01/30/2024 for dizziness/weakness   Admission Labs: Na 140, K 4.0, CO2 of 23, BUN 31, scr 1.07, glu 110 WBC 9.7, HgB 14, plt 204 UA RBC >50, no bacteria, negative nitrite, negative LE UDS positive for THC TSH 0.584  Admission Imaging Studies: CXR Progressively aneurysmal thoracic aorta, partially artifactual  This could be better assessed with repeat standard two view chest radiograph with close retention of patient positioning or CT imaging. CT head No acute intracranial abnormality. 2. Moderate periventricular white matter changes likely reflecting the sequela of small vessel ischemia. 3. 8 mm densely calcified left parafalcine meningioma without significant associated mass effect. 4. Chronic left frontal sinusitis MRI brain No evidence of acute intracranial abnormality   Significant Labs:   Significant Imaging Studies:   Antibiotic Therapy: Anti-infectives (From admission, onward)  None       Procedures:   Consultants:    Hospital Course by Problem: * Dizziness 02-01-2024 likely a combination of dehydration from diuretic use, sweating while working outside and insufficiency water intake combined with accidental  THC use.  Weakness 02-01-2024 likely a combination of dehydration from diuretic use, sweating while working outside and insufficiency water intake combined with accidental THC use.  Marijuana use 02-01-2024 accidental.  Met with pt and dtr at bedside. Dtr states that pt's grand-dtr works for cannabis dispensary. She hid some cannabis-brownies in the home. Pt may have accidentally eaten one as there is one missing from grand-dtrs supply.   Hypertensive urgency 02-01-2024 resolved.  GERD (gastroesophageal reflux disease) 02-01-2024 stable on PPI.  History of pulmonary embolism 02-01-2024 continue eliquis .  History of DVT (deep vein thrombosis) 02-01-2024 continue eliquis .    Discharge Diagnoses:  Principal Problem:   Dizziness Active Problems:   Weakness   Hypertensive urgency   Marijuana use   History of DVT (deep vein thrombosis)   History of pulmonary embolism   GERD (gastroesophageal reflux disease)   Discharge Instructions  Discharge Instructions     Call MD for:  difficulty breathing, headache or visual disturbances   Complete by: As directed    Call MD for:  extreme fatigue   Complete by: As directed    Call MD for:  hives   Complete by: As directed    Call MD for:  persistant dizziness or light-headedness   Complete by: As directed    Call MD for:  persistant nausea and vomiting   Complete by: As directed    Call MD for:  redness, tenderness, or signs of infection (pain, swelling, redness, odor or green/yellow discharge around incision site)   Complete by: As directed    Call MD for:  severe uncontrolled pain   Complete by: As directed    Call MD for:  temperature >100.4   Complete by: As directed    Diet - low sodium heart healthy   Complete by: As directed    Discharge instructions   Complete by: As directed    1. Follow up with your primary care provider in 1-2 weeks following discharge from hospital. 2. Drink plenty of water while working outside.  You need to urinate at least every 2 hours while working outdoors. If not, you are not consuming enough liquids.   Increase activity slowly   Complete by: As directed       Allergies as of 02/01/2024   No Known Allergies      Medication List     STOP taking these medications    triamcinolone  ointment 0.1 % Commonly known as: KENALOG        TAKE these medications    BENADRYL  ALLERGY PO Take 1 tablet by mouth at bedtime as needed (itching).   Eliquis  5 MG Tabs tablet Generic drug: apixaban  Take 1 tablet (5 mg total) by mouth 2 (two) times daily.   furosemide  20 MG tablet Commonly known as: LASIX  Take 1 tablet (20 mg total) by mouth daily.   metoprolol  tartrate 25 MG tablet Commonly known as: LOPRESSOR  Take 1 tablet (25 mg total) by mouth 3 (three) times daily. HOLD if systolic blood pressure (top number) is less than 100 and/or if heart rate less than 55   omeprazole  20 MG capsule Commonly known as: PRILOSEC Take 1 capsule (20 mg total) by mouth 2 (two) times daily before a meal. What changed:  when to take  this reasons to take this        Follow-up Information     [facility] Follow up.   Specialty: Home Health Services Why: [facility] will provide home health services for PT.  They will call you in the next 24-48 hours to set up services. Contact information: [address] [place] [address] [phone]                No Known Allergies  Discharge Exam: Vitals:   02/01/24 0451 02/01/24 0833  BP: (!) 153/76 (!) 179/58  Pulse: (!) 44 (!) 47  Resp: 16 19  Temp: 98.1 F (36.7 C) 98 F (36.7 C)  SpO2: 99% 99%    Physical Exam Vitals and nursing note reviewed.  Constitutional:      General: He is not in acute distress.    Appearance: He is not toxic-appearing or diaphoretic.  HENT:     Nose: Nose normal.  Cardiovascular:     Rate and Rhythm: Normal rate and regular rhythm.  Pulmonary:     Effort:  Pulmonary effort is normal.     Breath sounds: Normal breath sounds.  Abdominal:     General: Abdomen is flat. Bowel sounds are normal.     Palpations: Abdomen is soft.  Skin:    General: Skin is warm and dry.     Capillary Refill: Capillary refill takes less than 2 seconds.  Neurological:     Mental Status: He is alert and oriented to person, place, and time.     The results of significant diagnostics from this hospitalization (including imaging, microbiology, ancillary and laboratory) are listed below for reference.     Labs: BNP (last 3 results) Recent Labs    08/26/23 0000 08/26/23 1437  BNP 85.7 CANCELED   Basic Metabolic Panel: Recent Labs  Lab 01/30/24 2340  NA 140  K 4.0  CL 108  CO2 23  GLUCOSE 110*  BUN 31*  CREATININE 1.07  CALCIUM 9.3   Liver Function Tests: Recent Labs  Lab 01/30/24 2340  AST 23  ALT 15  ALKPHOS 46  BILITOT 0.6  PROT 7.0  ALBUMIN 4.1   CBC: Recent Labs  Lab 01/30/24 2340  WBC 9.7  HGB 14.0  HCT 43.3  MCV 90.2  PLT 204   Thyroid  function studies Recent Labs    01/31/24 1117  TSH 0.584   Urinalysis    Component Value Date/Time   COLORURINE YELLOW 01/31/2024 0306   APPEARANCEUR CLEAR 01/31/2024 0306   LABSPEC 1.010 01/31/2024 0306   PHURINE 6.0 01/31/2024 0306   GLUCOSEU NEGATIVE 01/31/2024 0306   HGBUR LARGE (A) 01/31/2024 0306   BILIRUBINUR NEGATIVE 01/31/2024 0306   KETONESUR NEGATIVE 01/31/2024 0306   PROTEINUR NEGATIVE 01/31/2024 0306   NITRITE NEGATIVE 01/31/2024 0306   LEUKOCYTESUR NEGATIVE 01/31/2024 0306   Sepsis Labs Recent Labs  Lab 01/30/24 2340  WBC 9.7    Urine Drug Screen: Lab Results  Component Value Date/Time   LABOPIA NONE DETECTED 01/31/2024 03:06 AM   COCAINSCRNUR NONE DETECTED 01/31/2024 03:06 AM   LABBENZ NONE DETECTED 01/31/2024 03:06 AM   AMPHETMU NONE DETECTED 01/31/2024 03:06 AM   THCU POSITIVE (A) 01/31/2024 03:06 AM   LABBARB NONE DETECTED 01/31/2024 03:06 AM      Procedures/Studies: MR BRAIN WO CONTRAST Result Date: 01/31/2024 CLINICAL DATA:  Syncope/presyncope, cerebrovascular cause suspected EXAM: MRI HEAD WITHOUT CONTRAST TECHNIQUE: Multiplanar, multiecho pulse sequences of the brain and surrounding structures were  obtained without intravenous contrast. COMPARISON:  CT head from the same day. FINDINGS: Brain: No acute infarction, hemorrhage, hydrocephalus, extra-axial collection or mass lesion. Cerebral atrophy. Moderate T2/FLAIR hyperintensities in the white matter, compatible with chronic microvascular disease. Vascular: Major arterial flow voids are maintained at the skull base. Skull and upper cervical spine: Normal marrow signal. Sinuses/Orbits: Clear sinuses.  No acute orbital findings. Other: No mastoid effusions. IMPRESSION: No evidence of acute intracranial abnormality. Electronically Signed   By: Stevenson Elbe M.D.   On: 01/31/2024 03:03   CT HEAD WO CONTRAST Result Date: 01/31/2024 CLINICAL DATA:  Central vertigo EXAM: CT HEAD WITHOUT CONTRAST TECHNIQUE: Contiguous axial images were obtained from the base of the skull through the vertex without intravenous contrast. RADIATION DOSE REDUCTION: This exam was performed according to the departmental dose-optimization program which includes automated exposure control, adjustment of the mA and/or kV according to patient size and/or use of iterative reconstruction technique. COMPARISON:  None Available. FINDINGS: Brain: Normal anatomic configuration. Parenchymal volume loss is commensurate with the patient's age. Moderate periventricular white matter changes are present likely reflecting the sequela of small vessel ischemia. 8 mm densely calcified left parafalcine meningioma noted without significant associated mass effect. No abnormal intra or extra-axial fluid collection. No abnormal mass effect or midline shift. No evidence of acute intracranial hemorrhage or infarct. Ventricular size is normal.  Cerebellum unremarkable. Vascular: No asymmetric hyperdense vasculature at the skull base. Skull: Intact Sinuses/Orbits: Densely opacified left frontal sinus with thickening of the sinus walls in keeping with changes of chronic sinusitis. Remaining paranasal sinuses are clear. Orbits are unremarkable. Other: Mastoid air cells and middle ear cavities are clear. IMPRESSION: 1. No acute intracranial abnormality. 2. Moderate periventricular white matter changes likely reflecting the sequela of small vessel ischemia. 3. 8 mm densely calcified left parafalcine meningioma without significant associated mass effect. 4. Chronic left frontal sinusitis. Electronically Signed   By: Worthy Heads M.D.   On: 01/31/2024 00:25   DG Chest 2 View Result Date: 01/31/2024 CLINICAL DATA:  Weakness 08/26/2023 EXAM: CHEST - 2 VIEW COMPARISON:  08/26/2023 FINDINGS: There is increasing with kyphotic positioning for this examination, however, the thoracic aorta appears progressively aneurysmal. Cardiac size at the upper limits of normal. Lungs are clear. No pneumothorax or pleural effusion. Stable midthoracic compression deformities. No acute bone abnormality. IMPRESSION: 1. Progressively aneurysmal thoracic aorta, partially artifactual. This could be better assessed with repeat standard two view chest radiograph with close retention of patient positioning or CT imaging. Electronically Signed   By: Worthy Heads M.D.   On: 01/31/2024 00:21    Time coordinating discharge: 55 mins  SIGNED:  Unk Garb, DO [facility] 02/01/24, 9:39 AM

## 2024-02-01 NOTE — Progress Notes (Signed)
 PROGRESS NOTE    EITAN DOUBLEDAY  ZOX:[id number] DOB: [birth year] DOA: 01/30/2024 PCP: Pcp, No  Subjective: Pt seen and examined. Met with pt and dtr at bedside. Dtr states that pt's grand-dtr works for cannabis dispensary. She hid some cannabis-brownies in the home. Pt may have accidentally eaten one as there is one missing from grand-dtrs supply.  Pt feels much better. He takes lasix  for LE edema. Pt works outside in the yard most days. Does not drink any water while outside. Does not urinate while he is outside.  Pt feels better after IVF overnight. No further dizziness. He is ready to go home.   Hospital Course: HPI: NEPHI SAVAGE is a 88 y.o. male with medical history significant of hypertension, DVT/PE on Eliquis , COPD, nephrolithiasis,presents with dizziness and inability to stand. He is accompanied by his daughter.   He experiences dizziness and an inability to stand, describing the sensation as feeling like he was going to fall. No sensation of the room spinning or feeling like he could pass out. No chest pain, arm pain, neck pain, or back pain. He feels weak during the dizziness, with the weakness affecting his entire body rather than one side.   There have been no recent changes in his medications, which include hydrochlorothiazide and metoprolol , both of which he has been taking for over a year. He was outside working in the yard and was not drinking a lot of fluids at the time of the incident. His daughter notes that whenever his diuretic is stopped, he experiences swelling in his legs.   He has a history of blood clots in his legs and one in his lung six months ago, for which he is on Eliquis . No chest discomfort, shortness of breath, cough, or fever.  Denied having any focal weakness or vision changes.   He does not use alcohol or tobacco currently, although he did smoke years ago.   In the emergency department patient was noted to be afebrile with blood pressures elevated up  to 200/91.  Labs obtained 6/5 significant for BUN elevated at 31 with creatinine 1.07, and all other labs relatively within normal limits. Chest x-ray noted progressive aneurysmal thoracic aorta that was partially artifactual. CT of the brain did not reveal any acute abnormality, 8 mm dense calcified left parafalcine meningioma without associated mass effect, and chronic left frontal sinusitis.  MRI of the brain revealed no acute intercranial abnormality.  Patient had been given 1 L of normal saline IV fluids, meclizine  25 mg p.o., metoprolol  25 mg p.o.Aaron Aas  There have been report of some marijuana brownies that family was concerned that the patient may have gotten a hold of which may have led to his presentation to the hospital.  Patient denied consuming any of these brownies.  However UDS screen was noted to be positive for THC.  Significant Events: Admitted 01/30/2024 for dizziness/weakness   Admission Labs: Na 140, K 4.0, CO2 of 23, BUN 31, scr 1.07, glu 110 WBC 9.7, HgB 14, plt 204 UA RBC >50, no bacteria, negative nitrite, negative LE UDS positive for THC TSH 0.584  Admission Imaging Studies: CXR Progressively aneurysmal thoracic aorta, partially artifactual  This could be better assessed with repeat standard two view chest radiograph with close retention of patient positioning or CT imaging. CT head No acute intracranial abnormality. 2. Moderate periventricular white matter changes likely reflecting the sequela of small vessel ischemia. 3. 8 mm densely calcified left parafalcine meningioma without significant associated mass effect. 4.  Chronic left frontal sinusitis MRI brain No evidence of acute intracranial abnormality   Significant Labs:   Significant Imaging Studies:   Antibiotic Therapy: Anti-infectives (From admission, onward)    None       Procedures:   Consultants:     Assessment and Plan: * Dizziness 02-01-2024 likely a combination of dehydration from diuretic use,  sweating while working outside and insufficiency water intake combined with accidental THC use.  Weakness 02-01-2024 likely a combination of dehydration from diuretic use, sweating while working outside and insufficiency water intake combined with accidental THC use.  Marijuana use 02-01-2024 accidental.  Met with pt and dtr at bedside. Dtr states that pt's grand-dtr works for cannabis dispensary. She hid some cannabis-brownies in the home. Pt may have accidentally eaten one as there is one missing from grand-dtrs supply.   Hypertensive urgency 02-01-2024 resolved.  GERD (gastroesophageal reflux disease) 02-01-2024 stable on PPI.  History of pulmonary embolism 02-01-2024 continue eliquis .  History of DVT (deep vein thrombosis) 02-01-2024 continue eliquis .  DVT prophylaxis:  apixaban  (ELIQUIS ) tablet 5 mg     Code Status: Full Code Family Communication: discussed with pt and dtr Renee at bedside Disposition Plan: return home Reason for continuing need for hospitalization: stable for DC.  Objective: Vitals:   01/31/24 1955 02/01/24 0000 02/01/24 0451 02/01/24 0833  BP: 137/75 (!) 150/57 (!) 153/76 (!) 179/58  Pulse: 65 (!) 58 (!) 44 (!) 47  Resp: 16 17 16 19   Temp: 97.9 F (36.6 C) 98 F (36.7 C) 98.1 F (36.7 C) 98 F (36.7 C)  TempSrc:      SpO2: 93% 95% 99% 99%  Weight:      Height:        Intake/Output Summary (Last 24 hours) at 02/01/2024 0936 Last data filed at 02/01/2024 0835 Gross per 24 hour  Intake 98.53 ml  Output 1750 ml  Net -1651.47 ml   Filed Weights   01/30/24 2347  Weight: 60.7 kg    Examination:  Physical Exam Vitals and nursing note reviewed.  Constitutional:      General: He is not in acute distress.    Appearance: He is not toxic-appearing or diaphoretic.  HENT:     Nose: Nose normal.  Cardiovascular:     Rate and Rhythm: Normal rate and regular rhythm.  Pulmonary:     Effort: Pulmonary effort is normal.     Breath sounds: Normal  breath sounds.  Abdominal:     General: Abdomen is flat. Bowel sounds are normal.     Palpations: Abdomen is soft.  Skin:    General: Skin is warm and dry.     Capillary Refill: Capillary refill takes less than 2 seconds.  Neurological:     Mental Status: He is alert and oriented to person, place, and time.     Data Reviewed: I have personally reviewed following labs and imaging studies  CBC: Recent Labs  Lab 01/30/24 2340  WBC 9.7  HGB 14.0  HCT 43.3  MCV 90.2  PLT 204   Basic Metabolic Panel: Recent Labs  Lab 01/30/24 2340  NA 140  K 4.0  CL 108  CO2 23  GLUCOSE 110*  BUN 31*  CREATININE 1.07  CALCIUM 9.3   GFR: Estimated Creatinine Clearance: 42.5 mL/min (by C-G formula based on SCr of 1.07 mg/dL). Liver Function Tests: Recent Labs  Lab 01/30/24 2340  AST 23  ALT 15  ALKPHOS 46  BILITOT 0.6  PROT 7.0  ALBUMIN 4.1  Coagulation Profile: Recent Labs  Lab 01/30/24 2344  INR 1.2   BNP (last 3 results) Recent Labs    08/26/23 0000 08/26/23 1437  BNP 85.7 CANCELED   Thyroid  Function Tests: Recent Labs    01/31/24 1117  TSH 0.584   Radiology Studies: MR BRAIN WO CONTRAST Result Date: 01/31/2024 CLINICAL DATA:  Syncope/presyncope, cerebrovascular cause suspected EXAM: MRI HEAD WITHOUT CONTRAST TECHNIQUE: Multiplanar, multiecho pulse sequences of the brain and surrounding structures were obtained without intravenous contrast. COMPARISON:  CT head from the same day. FINDINGS: Brain: No acute infarction, hemorrhage, hydrocephalus, extra-axial collection or mass lesion. Cerebral atrophy. Moderate T2/FLAIR hyperintensities in the white matter, compatible with chronic microvascular disease. Vascular: Major arterial flow voids are maintained at the skull base. Skull and upper cervical spine: Normal marrow signal. Sinuses/Orbits: Clear sinuses.  No acute orbital findings. Other: No mastoid effusions. IMPRESSION: No evidence of acute intracranial abnormality.  Electronically Signed   By: Stevenson Elbe M.D.   On: 01/31/2024 03:03   CT HEAD WO CONTRAST Result Date: 01/31/2024 CLINICAL DATA:  Central vertigo EXAM: CT HEAD WITHOUT CONTRAST TECHNIQUE: Contiguous axial images were obtained from the base of the skull through the vertex without intravenous contrast. RADIATION DOSE REDUCTION: This exam was performed according to the departmental dose-optimization program which includes automated exposure control, adjustment of the mA and/or kV according to patient size and/or use of iterative reconstruction technique. COMPARISON:  None Available. FINDINGS: Brain: Normal anatomic configuration. Parenchymal volume loss is commensurate with the patient's age. Moderate periventricular white matter changes are present likely reflecting the sequela of small vessel ischemia. 8 mm densely calcified left parafalcine meningioma noted without significant associated mass effect. No abnormal intra or extra-axial fluid collection. No abnormal mass effect or midline shift. No evidence of acute intracranial hemorrhage or infarct. Ventricular size is normal. Cerebellum unremarkable. Vascular: No asymmetric hyperdense vasculature at the skull base. Skull: Intact Sinuses/Orbits: Densely opacified left frontal sinus with thickening of the sinus walls in keeping with changes of chronic sinusitis. Remaining paranasal sinuses are clear. Orbits are unremarkable. Other: Mastoid air cells and middle ear cavities are clear. IMPRESSION: 1. No acute intracranial abnormality. 2. Moderate periventricular white matter changes likely reflecting the sequela of small vessel ischemia. 3. 8 mm densely calcified left parafalcine meningioma without significant associated mass effect. 4. Chronic left frontal sinusitis. Electronically Signed   By: Worthy Heads M.D.   On: 01/31/2024 00:25   DG Chest 2 View Result Date: 01/31/2024 CLINICAL DATA:  Weakness 08/26/2023 EXAM: CHEST - 2 VIEW COMPARISON:  08/26/2023  FINDINGS: There is increasing with kyphotic positioning for this examination, however, the thoracic aorta appears progressively aneurysmal. Cardiac size at the upper limits of normal. Lungs are clear. No pneumothorax or pleural effusion. Stable midthoracic compression deformities. No acute bone abnormality. IMPRESSION: 1. Progressively aneurysmal thoracic aorta, partially artifactual. This could be better assessed with repeat standard two view chest radiograph with close retention of patient positioning or CT imaging. Electronically Signed   By: Worthy Heads M.D.   On: 01/31/2024 00:21    Scheduled Meds:  apixaban   5 mg Oral BID   metoprolol  tartrate  25 mg Oral BID   sodium chloride  flush  3 mL Intravenous Q12H   Continuous Infusions:   LOS: 0 days   Time spent: 50 minutes  Unk Garb, DO  [facility]  02/01/2024, 9:36 AM

## 2024-02-03 ENCOUNTER — Telehealth: Payer: Self-pay

## 2024-02-03 NOTE — Transitions of Care (Post Inpatient/ED Visit) (Signed)
   02/03/2024  Name: David [name] MRN: [id number] DOB: [birth year]  Today's TOC FU Call Status: Today's TOC FU Call Status:: Unsuccessful Call (1st Attempt) Unsuccessful Call (1st Attempt) Date: 02/03/24  Attempted to reach the patient regarding the most recent Inpatient/ED visit.  Follow Up Plan: Additional outreach attempts will be made to reach the patient to complete the Transitions of Care (Post Inpatient/ED visit) call.   Signature  Germain Kohler, CMA (AAMA)  [facility] [phone]

## 2024-02-12 ENCOUNTER — Ambulatory Visit: Admitting: Pulmonary Disease

## 2024-02-12 ENCOUNTER — Other Ambulatory Visit (HOSPITAL_COMMUNITY): Payer: Self-pay

## 2024-02-12 ENCOUNTER — Encounter: Payer: Self-pay | Admitting: Pulmonary Disease

## 2024-02-12 VITALS — BP 128/60 | HR 43 | Ht 65.5 in | Wt 130.0 lb

## 2024-02-12 DIAGNOSIS — I2699 Other pulmonary embolism without acute cor pulmonale: Secondary | ICD-10-CM | POA: Diagnosis not present

## 2024-02-12 DIAGNOSIS — J432 Centrilobular emphysema: Secondary | ICD-10-CM | POA: Diagnosis not present

## 2024-02-12 MED ORDER — APIXABAN 2.5 MG PO TABS
2.5000 mg | ORAL_TABLET | Freq: Two times a day (BID) | ORAL | 11 refills | Status: AC
  Filled 2024-02-12: qty 60, 30d supply, fill #0
  Filled 2024-03-30: qty 60, 30d supply, fill #1
  Filled 2024-05-25: qty 60, 30d supply, fill #2
  Filled 2024-07-14: qty 60, 30d supply, fill #0
  Filled 2024-08-21: qty 60, 30d supply, fill #1

## 2024-02-12 NOTE — Patient Instructions (Addendum)
 We will transition you to low dose eliquis  2.5mg  twice daily for the blood clots  We can consider breathing tests in the future if your breathing changes  Follow up in 6 months, call sooner if needed

## 2024-02-12 NOTE — Progress Notes (Signed)
 Synopsis: Referred in June 2025 for Pulmonary emboli  Subjective:   PATIENT ID: David [name] GENDER: male DOB: 1935-11-03, MRN: [id number]  HPI  Chief Complaint  Patient presents with   Follow-up    Pt states  he feels  as if breathing been heavy than normal.   Brandonlee Navis is an 88 year old male, never smoker with history of COPD, CKDAIIIa and DVT/PE who returns to pulmonary clinic for follow up.   He was admitted 08/27/23 to 08/31/23 for acute left lower extremity DVT and bilateral pulmonary emboli. He was treated with heparin  and then transitioned to eliquis .  Echo during admission showed EF 40-45%. RV size mildly enlarged with normal function.   He is feeling much better currently. Lower extremity edema is basically gone. He remains active working around his home. No issues with bleeding in his stool or urine.   He had recently hospital admission due to dizziness and dehydration. He tested positive for THC. His daughter later found out that he likely ate a brownie from a family member who is living with him, that contained THC which led to his symptoms and positive drug screen.   Past Medical History:  Diagnosis Date   Acute deep vein thrombosis (DVT) of proximal vein of left lower extremity (HCC) 08/28/2023   Acute pulmonary embolism  with L DVT 08/27/2023   Onset ? But dx made 08/27/23 with extensive L DVT by venous dopplers   -  RV strain on CTa 08/27/23 with Echo 08/29/23  ef 40% / lots of pvc's/ no elevation PAS or RV abnormalities and walked halls/ did steps prior to d/c on Eliquis    - 09/13/2023 reports sob across the room at home since d/c but mb and back ok since d/c though LLE swelling gradually improving   - 09/13/2023   Walked on RA   x  2  lap   Bell's palsy    COPD (chronic obstructive pulmonary disease) (HCC) 08/28/2023   Kidney stones    UTI (urinary tract infection)      Family History  Problem Relation Age of Onset   Heart disease Brother    Liver disease  Neg Hx    Esophageal cancer Neg Hx    Colon cancer Neg Hx      Social History   Socioeconomic History   Marital status: Widowed    Spouse name: Not on file   Number of children: 2   Years of education: Not on file   Highest education level: Not on file  Occupational History   Occupation: retired  Tobacco Use   Smoking status: Never   Smokeless tobacco: Never  Vaping Use   Vaping status: Never Used  Substance and Sexual Activity   Alcohol use: No   Drug use: No   Sexual activity: Not on file  Other Topics Concern   Not on file  Social History Narrative   Not on file   Social Drivers of Health   Financial Resource Strain: Not on file  Food Insecurity: Patient Declined (01/31/2024)   Hunger Vital Sign    Worried About Running Out of Food in the Last Year: Patient declined    Ran Out of Food in the Last Year: Patient declined  Transportation Needs: Patient Declined (01/31/2024)   PRAPARE - Administrator, Civil Service (Medical): Patient declined    Lack of Transportation (Non-Medical): Patient declined  Physical Activity: Not on file  Stress: Not on file  Social  Connections: Patient Declined (01/31/2024)   Social Connection and Isolation Panel    Frequency of Communication with Friends and Family: Patient declined    Frequency of Social Gatherings with Friends and Family: Patient declined    Attends Religious Services: Patient declined    Database administrator or Organizations: Patient declined    Attends Banker Meetings: Patient declined    Marital Status: Patient declined  Intimate Partner Violence: Unknown (01/31/2024)   Humiliation, Afraid, Rape, and Kick questionnaire    Fear of Current or Ex-Partner: Patient declined    Emotionally Abused: Patient declined    Physically Abused: Patient declined    Sexually Abused: No     No Known Allergies   Outpatient Medications Prior to Visit  Medication Sig Dispense Refill   diphenhydrAMINE  HCl  (BENADRYL  ALLERGY PO) Take 1 tablet by mouth at bedtime as needed (itching).     furosemide  (LASIX ) 20 MG tablet Take 1 tablet (20 mg total) by mouth daily. 90 tablet 3   metoprolol  tartrate (LOPRESSOR ) 25 MG tablet Take 1 tablet (25 mg total) by mouth 3 (three) times daily. HOLD if systolic blood pressure (top number) is less than 100 and/or if heart rate less than 55 180 tablet 3   omeprazole  (PRILOSEC) 20 MG capsule Take 1 capsule (20 mg total) by mouth 2 (two) times daily before a meal. (Patient taking differently: Take 20 mg by mouth as needed (heartburn and GERD).) 180 capsule 1   apixaban  (ELIQUIS ) 5 MG TABS tablet Take 1 tablet (5 mg total) by mouth 2 (two) times daily. 60 tablet 4   No facility-administered medications prior to visit.    Review of Systems  Constitutional:  Negative for chills, fever, malaise/fatigue and weight loss.  HENT:  Negative for congestion, sinus pain and sore throat.   Eyes: Negative.   Respiratory:  Negative for cough, hemoptysis, sputum production, shortness of breath and wheezing.   Cardiovascular:  Negative for chest pain, palpitations, orthopnea, claudication and leg swelling.  Gastrointestinal:  Negative for abdominal pain, heartburn, nausea and vomiting.  Genitourinary: Negative.   Musculoskeletal:  Negative for joint pain and myalgias.  Skin:  Negative for rash.  Neurological:  Negative for weakness.  Endo/Heme/Allergies: Negative.   Psychiatric/Behavioral: Negative.        Objective:   Vitals:   02/12/24 0928  BP: 128/60  Pulse: (!) 43  SpO2: 100%  Weight: 130 lb (59 kg)  Height: 5' 5.5 (1.664 m)     Physical Exam Constitutional:      General: He is not in acute distress.    Appearance: Normal appearance.   Eyes:     General: No scleral icterus.    Conjunctiva/sclera: Conjunctivae normal.    Cardiovascular:     Rate and Rhythm: Normal rate and regular rhythm.  Pulmonary:     Breath sounds: No wheezing, rhonchi or rales.    Musculoskeletal:     Right lower leg: No edema.     Left lower leg: No edema.   Skin:    General: Skin is warm and dry.   Neurological:     General: No focal deficit present.       CBC    Component Value Date/Time   WBC 6.2 02/01/2024 1018   RBC 4.62 02/01/2024 1018   HGB 13.1 02/01/2024 1018   HGB 14.2 12/16/2023 1534   HCT 41.0 02/01/2024 1018   HCT 42.7 12/16/2023 1534   PLT 176 02/01/2024 1018   PLT  206 12/16/2023 1534   MCV 88.7 02/01/2024 1018   MCV 86 12/16/2023 1534   MCH 28.4 02/01/2024 1018   MCHC 32.0 02/01/2024 1018   RDW 15.4 02/01/2024 1018   RDW 13.8 12/16/2023 1534   LYMPHSABS 1.7 12/16/2023 1534   MONOABS 0.5 08/27/2023 1853   EOSABS 0.3 12/16/2023 1534   BASOSABS 0.1 12/16/2023 1534      Latest Ref Rng & Units 02/01/2024   10:18 AM 01/30/2024   11:40 PM 12/16/2023    3:34 PM  BMP  Glucose 70 - 99 mg/dL 65  536  83   BUN 8 - 23 mg/dL 23  31  22    Creatinine 0.61 - 1.24 mg/dL 6.44  0.34  7.42   BUN/Creat Ratio 10 - 24   22   Sodium 135 - 145 mmol/L 142  140  142   Potassium 3.5 - 5.1 mmol/L 3.7  4.0  4.3   Chloride 98 - 111 mmol/L 112  108  104   CO2 22 - 32 mmol/L 24  23  23    Calcium 8.9 - 10.3 mg/dL 8.4  9.3  9.0    Chest imaging: CTA Chest 08/27/23 Cardiovascular: Thoracic aorta demonstrates atherosclerotic calcifications without aneurysmal dilatation. The pulmonary artery demonstrates extensive bilateral pulmonary emboli with evidence of right heart strain within RV/LV ratio of 1.0. Coronary calcifications are noted. Cardiac enlargement is noted.   Mediastinum/Nodes: Thoracic inlet is within normal limits. No hilar or mediastinal adenopathy is noted. The esophagus as visualized is within normal limits.   Lungs/Pleura: Lungs are well aerated bilaterally. Mild emphysematous changes are noted. No focal infiltrate or sizable effusion is seen.  PFT:     No data to display          Labs:  Path:  Echo:  Heart  Catheterization:       Assessment & Plan:   Pulmonary embolism, unspecified chronicity, unspecified pulmonary embolism type, unspecified whether acute cor pulmonale present (HCC) - Plan: apixaban  (ELIQUIS ) 2.5 MG TABS tablet  Centrilobular emphysema (HCC)  Discussion: David [name] is an 88 year old male, never smoker with history of COPD, CKDAIIIa and DVT/PE who returns to pulmonary clinic for follow up.   Pulmonary Emboli and DVT Unclear etiology with no provoking factors. On therapeutic Eliquis  for six months.  - Reduce Eliquis  dose to 2.5 mg twice daily. - Educated on signs of clot recurrence and importance of contacting healthcare provider if symptoms arise.  Mild Emphysema - noted on CT chest - no breathing issues at this time - monitor  Follow up in 6 months.  Duaine German, MD [facility] Office: [phone]    Current Outpatient Medications:    apixaban  (ELIQUIS ) 2.5 MG TABS tablet, Take 1 tablet (2.5 mg total) by mouth 2 (two) times daily., Disp: 60 tablet, Rfl: 11   diphenhydrAMINE  HCl (BENADRYL  ALLERGY PO), Take 1 tablet by mouth at bedtime as needed (itching)., Disp: , Rfl:    furosemide  (LASIX ) 20 MG tablet, Take 1 tablet (20 mg total) by mouth daily., Disp: 90 tablet, Rfl: 3   metoprolol  tartrate (LOPRESSOR ) 25 MG tablet, Take 1 tablet (25 mg total) by mouth 3 (three) times daily. HOLD if systolic blood pressure (top number) is less than 100 and/or if heart rate less than 55, Disp: 180 tablet, Rfl: 3   omeprazole  (PRILOSEC) 20 MG capsule, Take 1 capsule (20 mg total) by mouth 2 (two) times daily before a meal. (Patient taking differently: Take 20  mg by mouth as needed (heartburn and GERD).), Disp: 180 capsule, Rfl: 1

## 2024-02-17 ENCOUNTER — Other Ambulatory Visit (HOSPITAL_COMMUNITY): Payer: Self-pay

## 2024-02-27 ENCOUNTER — Other Ambulatory Visit (HOSPITAL_COMMUNITY): Payer: Self-pay

## 2024-03-09 ENCOUNTER — Other Ambulatory Visit (HOSPITAL_COMMUNITY): Payer: Self-pay

## 2024-03-16 ENCOUNTER — Ambulatory Visit: Attending: Cardiology | Admitting: Cardiology

## 2024-03-16 ENCOUNTER — Encounter: Payer: Self-pay | Admitting: Cardiology

## 2024-03-16 ENCOUNTER — Ambulatory Visit: Attending: Cardiology

## 2024-03-16 VITALS — BP 162/64 | HR 43 | Resp 16 | Ht 65.0 in | Wt 129.4 lb

## 2024-03-16 DIAGNOSIS — R9389 Abnormal findings on diagnostic imaging of other specified body structures: Secondary | ICD-10-CM

## 2024-03-16 DIAGNOSIS — I493 Ventricular premature depolarization: Secondary | ICD-10-CM

## 2024-03-16 DIAGNOSIS — I82512 Chronic embolism and thrombosis of left femoral vein: Secondary | ICD-10-CM

## 2024-03-16 DIAGNOSIS — I2699 Other pulmonary embolism without acute cor pulmonale: Secondary | ICD-10-CM

## 2024-03-16 DIAGNOSIS — R001 Bradycardia, unspecified: Secondary | ICD-10-CM

## 2024-03-16 DIAGNOSIS — R0609 Other forms of dyspnea: Secondary | ICD-10-CM | POA: Diagnosis not present

## 2024-03-16 NOTE — Progress Notes (Unsigned)
 Cardiology Office Note:  .    ID:  [name], DOB [birth year], MRN [id number] PCP:  Pcp, No  [facility] Providers Cardiologist:  Madonna Large, DO  Electrophysiologist:  None  Click to update primary MD,subspecialty MD or APP then REFRESH:1}    Chief Complaint  Patient presents with   Dyspnea on exertion   Follow-up    History of Present Illness: .   David [name] is a 88 y.o. Caucasian male whose past medical history and cardiovascular risk factors includes: bilateral extensive pulmonary embolism (August 27, 2023), DVT (left lower extremity 08/27/2023) aortic atherosclerosis, right bundle branch block, advanced age.  Patient is accompanied by his daughter David [name] at today's office visit and she also provides collateral history during this encounter.    In December 2024 patient was seen for worsening shortness of breath as well as lower extremity swelling.  Clinical concern for possible venous thromboembolic event was sent to the hospital for further evaluation and management.  He was hospitalized in December 2024/January 2025 for acute bilateral PE and acute DVTs.  Echocardiogram during the hospitalization noted mildly reduced LVEF with no regional wall motion abnormalities.  After being treated for bilateral pulmonary embolism at follow-up he endorsed precordial chest pain/heaviness.  Shared decision was to proceed with stress test.  He underwent myocardial perfusion imaging in January 2025 which was reported to be low risk.  His cardiac monitor in the past noted a PVC burden of approximately 11.4% he also had episodes of PSVT.  At the last office visit his Lopressor  was increased from 25 mg p.o. twice daily to 3 times daily with holding parameters.  Since last office visit patient was seen in the hospital from 01/30/2024 - 02/01/2024 due to dizziness and the inability to stand.  Patient was outside working in the hot weather and dehydrated and also on antihypertensive  medications as well as AV nodal blocking agents.  Urine drug screen positive for THC.  Since last office visit patient denies any anginal chest pain or heart failure symptoms.  No significant weight gain.  Physical endurance remains stable. Home SBP are not checked.  Patient is bradycardic on multiple occasions but asymptomatic.   Review of Systems: .   Review of Systems  Cardiovascular:  Negative for chest pain, claudication, irregular heartbeat, leg swelling, near-syncope, orthopnea, palpitations, paroxysmal nocturnal dyspnea and syncope.  Respiratory:  Negative for shortness of breath.   Hematologic/Lymphatic: Negative for bleeding problem.    Studies Reviewed:   EKG Interpretation Date/Time:  Monday March 16 2024 11:34:33 EDT Ventricular Rate:  41 PR Interval:  190 QRS Duration:  152 QT Interval:  500 QTC Calculation: 412 R Axis:   116  Text Interpretation: Marked sinus bradycardia Right bundle branch block When compared with ECG of 31-Jan-2024 00:59, Premature ventricular complexes is no longer Present Confirmed by Large Madonna [id number]) on 03/16/2024 11:39:06 AM  Echocardiogram: March 2024: LVEF 55 to 60%, indeterminate diastolic pattern, moderate LAE, moderate MR, mild to moderate AR, estimated RAP 8 mmHg, frequent PVCs noted  January 2025: LVEF 40-45%, grade 1 diastolic dysfunction, right ventricular size is mildly enlarged, RV systolic function is normal, trivial MR, mild AR, estimated RAP 3 mmHg.  MPI 09/19/23   The study is normal. The study is low risk.   No ST deviation was noted.   LV perfusion is normal. There is no evidence of ischemia. There is no evidence of infarction.   Left ventricular function is normal. Nuclear stress EF: 49%.  The left ventricular ejection fraction is mildly decreased (45-54%). End diastolic cavity size is moderately enlarged. End systolic cavity size is moderately enlarged.  Cardiac monitor (Zio Patch): September 23, 2023-September 28, 2023 Dominant rhythm sinus. Heart rate 39-148 bpm.  Avg HR 67 bpm. No atrial fibrillation detected during the monitoring period. No ventricular tachycardia, high grade AV block, pauses (3 seconds or longer). Total supraventricular ectopic burden <1%. 1 episode of PSVT, 09/24/2023 at 1:28 PM, 10 beats, 5.2 seconds in duration, average HR 131 bpm, max HR 148 bpm Total ventricular ectopic burden 11.4% (predominantly isolated beats). Patient triggered events: 0.   RADIOLOGY: Chest xray:  08/26/2023 1. No acute cardiopulmonary disease. 2. Chronic hyperinflation and coarsened interstitial markings suggestive of COPD.   Risk Assessment/Calculations:   NA  Labs:       Latest Ref Rng & Units 02/01/2024   10:18 AM 01/30/2024   11:40 PM 12/16/2023    3:34 PM  CBC  WBC 4.0 - 10.5 K/uL 6.2  9.7  8.2   Hemoglobin 13.0 - 17.0 g/dL 86.8  85.9  85.7   Hematocrit 39.0 - 52.0 % 41.0  43.3  42.7   Platelets 150 - 400 K/uL 176  204  206        Latest Ref Rng & Units 02/01/2024   10:18 AM 01/30/2024   11:40 PM 12/16/2023    3:34 PM  BMP  Glucose 70 - 99 mg/dL 65  889  83   BUN 8 - 23 mg/dL 23  31  22    Creatinine 0.61 - 1.24 mg/dL 8.87  8.92  9.00   BUN/Creat Ratio 10 - 24   22   Sodium 135 - 145 mmol/L 142  140  142   Potassium 3.5 - 5.1 mmol/L 3.7  4.0  4.3   Chloride 98 - 111 mmol/L 112  108  104   CO2 22 - 32 mmol/L 24  23  23    Calcium 8.9 - 10.3 mg/dL 8.4  9.3  9.0       Latest Ref Rng & Units 02/01/2024   10:18 AM 01/30/2024   11:40 PM 12/16/2023    3:34 PM  CMP  Glucose 70 - 99 mg/dL 65  889  83   BUN 8 - 23 mg/dL 23  31  22    Creatinine 0.61 - 1.24 mg/dL 8.87  8.92  9.00   Sodium 135 - 145 mmol/L 142  140  142   Potassium 3.5 - 5.1 mmol/L 3.7  4.0  4.3   Chloride 98 - 111 mmol/L 112  108  104   CO2 22 - 32 mmol/L 24  23  23    Calcium 8.9 - 10.3 mg/dL 8.4  9.3  9.0   Total Protein 6.5 - 8.1 g/dL  7.0    Total Bilirubin 0.0 - 1.2 mg/dL  0.6    Alkaline Phos 38 - 126 U/L  46    AST  15 - 41 U/L  23    ALT 0 - 44 U/L  15      No results found for: CHOL, HDL, LDLCALC, LDLDIRECT, TRIG, CHOLHDL No results for input(s): LIPOA in the last 8760 hours. No components found for: NTPROBNP Recent Labs    09/13/23 1331 09/20/23 1317  PROBNP 3,168* 1,034*   Recent Labs    08/26/23 1437 01/31/24 1117  TSH 3.950 0.584     Physical Exam:    Today's Vitals   03/16/24 1059  BP: ROLLEN)  162/64  Pulse: (!) 43  Resp: 16  SpO2: 97%  Weight: 129 lb 6.4 oz (58.7 kg)  Height: 5' 5 (1.651 m)   Body mass index is 21.53 kg/m. Wt Readings from Last 3 Encounters:  03/16/24 129 lb 6.4 oz (58.7 kg)  02/12/24 130 lb (59 kg)  01/30/24 133 lb 13.1 oz (60.7 kg)    Physical Exam  Constitutional: No distress.  hemodynamically stable  Neck: No JVD present.  Cardiovascular: Regular rhythm, S1 normal and S2 normal. Frequent extrasystoles are present. Bradycardia present. Exam reveals no gallop, no S3 and no S4.  No murmur heard. Pulmonary/Chest: Effort normal. No stridor. He has no wheezes. He has no rales.  Decreased breath sounds bilaterally, poor inspiratory effort  Abdominal: Soft. Bowel sounds are normal. He exhibits no distension. There is no abdominal tenderness.  Musculoskeletal:        General: No tenderness or edema.     Cervical back: Neck supple.  Neurological: He is alert and oriented to person, place, and time. He has intact cranial nerves (2-12).  Skin: Skin is warm.   Impression & Recommendation(s):  Impression:   ICD-10-CM   1. Dyspnea on exertion  R06.09 EKG 12-Lead    CANCELED: EKG 12-Lead    2. Abnormal chest x-ray  R93.89 CT Chest Wo Contrast    3. PVC (premature ventricular contraction)  I49.3 LONG TERM MONITOR XT (3-14 DAYS)    4. Bradycardia  R00.1 LONG TERM MONITOR XT (3-14 DAYS)    5. Chronic deep vein thrombosis (DVT) of femoral vein of left lower extremity (HCC)  I82.512     6. Pulmonary embolism, bilateral (HCC)  I26.99        Recommendation(s):  Dyspnea on exertion Multifactorial: History of DVT PE, deconditioning, bradycardia, PVCs Overall improving. Has undergone appropriate ischemic workup as outlined above. No additional cardiovascular testing warranted at this time Follows with pulmonary medicine as well.  Abnormal chest x-ray Recent chest x-ray during hospitalization noted concerns for possible thoracic aortic aneurysm and chest x-ray but accuracy limited due to artifactual presence. Recommended conservative management versus CT chest without contrast for clarification.  Patient and daughter feel over the latter. Will order CT chest without contrast  PVC (premature ventricular contraction) Bradycardia Currently on metoprolol  tartrate 25 mg p.o. 3 times daily. If he has symptoms of symptomatic bradycardia would recommend downgrading metoprolol  to 25 mg p.o. twice daily. Will repeat a 7-day Zio prior to the next office visit Also asked him to start checking blood pressures at home.   Chronic deep vein thrombosis (DVT) of femoral vein of left lower extremity (HCC) Pulmonary embolism, bilateral (HCC) Now on Eliquis  2.5 mg p.o. twice daily, managed by pulmonary medicine per patient   Orders Placed:  Orders Placed This Encounter  Procedures   CT Chest Wo Contrast    Standing Status:   Future    Expected Date:   03/23/2024    Expiration Date:   03/16/2025    [facility]   LONG TERM MONITOR XT (3-14 DAYS)    Standing Status:   Future    Number of Occurrences:   1    Expected Date:   07/27/2024    Expiration Date:   03/16/2025    Where should this test be performed?:   [facility]    Does the patient have an implanted cardiac device?:   No    Prescribed days of wear:   7  Type of enrollment:   Home Enrollment    Vendor::   Zio    Reason for Exam:   PVC's I49.3, 147.10   EKG 12-Lead   Final Medication List:    No orders of the defined types  were placed in this encounter.   There are no discontinued medications.    Current Outpatient Medications:    apixaban  (ELIQUIS ) 2.5 MG TABS tablet, Take 1 tablet (2.5 mg total) by mouth 2 (two) times daily., Disp: 60 tablet, Rfl: 11   diphenhydrAMINE  HCl (BENADRYL  ALLERGY PO), Take 1 tablet by mouth at bedtime as needed (itching)., Disp: , Rfl:    furosemide  (LASIX ) 20 MG tablet, Take 1 tablet (20 mg total) by mouth daily., Disp: 90 tablet, Rfl: 3   metoprolol  tartrate (LOPRESSOR ) 25 MG tablet, Take 1 tablet (25 mg total) by mouth 3 (three) times daily. HOLD if systolic blood pressure (top number) is less than 100 and/or if heart rate less than 55, Disp: 180 tablet, Rfl: 3   omeprazole  (PRILOSEC) 20 MG capsule, Take 1 capsule (20 mg total) by mouth 2 (two) times daily before a meal. (Patient taking differently: Take 20 mg by mouth as needed (heartburn and GERD).), Disp: 180 capsule, Rfl: 1  Consent:   N/A  Disposition:   5-month  His questions and concerns were addressed to his satisfaction. He voices understanding of the recommendations provided during this encounter.    Signed, Madonna Michele HAS, [facility]  A Division of [facility] [address], [place], [place] [address]  [place], [place] [address]

## 2024-03-16 NOTE — Progress Notes (Unsigned)
 Enrolled for [employer] to mail a ZIO XT long term holter monitor to the patients address on file.

## 2024-03-16 NOTE — Patient Instructions (Addendum)
 Medication Instructions:  No changes.  *If you need a refill on your cardiac medications before your next appointment, please call your pharmacy*  Testing/Procedures: Non-Cardiac CT scanning, (CAT scanning), is a noninvasive, special x-ray that produces cross-sectional images of the body using x-rays and a computer. CT scans help physicians diagnose and treat medical conditions. For some CT exams, a contrast material is used to enhance visibility in the area of the body being studied. CT scans provide greater clarity and reveal more details than regular x-ray exams.   2. Your physician has requested that you wear a Zio heart monitor for 7 days in 07/2024. This will be mailed to your home with instructions on how to apply the monitor and how to return it when finished. Please allow 2 weeks after returning the heart monitor before our office calls you with the results.    Follow-Up: At [facility], you and your health needs are our priority.  As part of our continuing mission to provide you with exceptional heart care, our providers are all part of one team.  This team includes your primary Cardiologist (physician) and Advanced Practice Providers or APPs (Physician Assistants and Nurse Practitioners) who all work together to provide you with the care you need, when you need it.  Your next appointment:   6 month(s)  Provider:   Madonna Large, DO    We recommend signing up for the patient portal called MyChart.  Sign up information is provided on this After Visit Summary.  MyChart is used to connect with patients for Virtual Visits (Telemedicine).  Patients are able to view lab/test results, encounter notes, upcoming appointments, etc.  Non-urgent messages can be sent to your provider as well.   To learn more about what you can do with MyChart, go to [email or web address].   Other Instructions ZIO XT- Long Term Monitor Instructions Your physician has requested you wear a ZIO patch  monitor for 7 days.  This is a single patch monitor. [employer] supplies one patch monitor per enrollment. Additional stickers are not available. Please do not apply patch if you will be having a Nuclear Stress Test, Echocardiogram, Cardiac CT, MRI, or Chest Xray during the period you would be wearing the monitor. The patch cannot be worn during these tests. You cannot remove and re-apply the ZIO XT patch monitor.  Your ZIO patch monitor will be mailed 3 day USPS to your address on file. It may take 3-5 days to receive your monitor after you have been enrolled.  Once you have received your monitor, please review the enclosed instructions. Your monitor has already been registered assigning a specific monitor serial # to you.    Billing and Patient Assistance Program Information We have supplied [employer] with any of your insurance information on file for billing purposes. [employer] offers a sliding scale Patient Assistance Program for patients that do not have insurance, or whose insurance does not completely cover the cost of the ZIO monitor. You must apply for the Patient Assistance Program to qualify for this discounted rate.  To apply, please call [employer] at [phone], select option 4, select option 2, ask to apply for Patient Assistance Program. Meredeth will ask your household income, and how many people are in your household. They will quote your out-of-pocket cost based on that information. [employer] will also be able to set up a 1-month, interest-free payment plan if needed.    Applying the monitor  Shave hair from upper left chest.  Hold abrader disc  by orange tab. Rub abrader in 40 strokes over the upper left chest as  indicated in your monitor instructions.  Clean area with 4 enclosed alcohol pads. Let dry.  Apply patch as indicated in monitor instructions. Patch will be placed under collarbone on left  side of chest with arrow pointing upward.  Rub patch adhesive wings  for 2 minutes. Remove white label marked 1. Remove the white  label marked 2. Rub patch adhesive wings for 2 additional minutes.  While looking in a mirror, press and release button in center of patch. A small green light will  flash 3-4 times. This will be your only indicator that the monitor has been turned on.  Do not shower for the first 24 hours. You may shower after the first 24 hours.  Press the button if you feel a symptom. You will hear a small click. Record Date, Time and  Symptom in the Patient Logbook.  When you are ready to remove the patch, follow instructions on the last 2 pages of Patient Logbook. Stick patch monitor onto the last page of Patient Logbook.  Place Patient Logbook in the blue and white box. Use locking tab on box and tape box closed securely. The blue and white box has prepaid postage on it. Please place it in the mailbox as soon as possible. Your physician should have your test results approximately 7 days after the monitor has been mailed back to [facility].  Call [facility] Customer Care at [phone] if you have questions regarding your ZIO XT patch monitor. Call them immediately if you see an orange light blinking on your monitor.  If your monitor falls off in less than 4 days, contact our Monitor department at [phone].  If your monitor becomes loose or falls off after 4 days call [employer] at [phone] for suggestions on securing your monitor.

## 2024-03-17 ENCOUNTER — Ambulatory Visit (HOSPITAL_BASED_OUTPATIENT_CLINIC_OR_DEPARTMENT_OTHER): Admitting: Family Medicine

## 2024-03-18 ENCOUNTER — Encounter: Payer: Self-pay | Admitting: Cardiology

## 2024-03-30 ENCOUNTER — Other Ambulatory Visit (HOSPITAL_COMMUNITY): Payer: Self-pay

## 2024-03-31 ENCOUNTER — Ambulatory Visit (HOSPITAL_BASED_OUTPATIENT_CLINIC_OR_DEPARTMENT_OTHER)
Admission: RE | Admit: 2024-03-31 | Discharge: 2024-03-31 | Disposition: A | Discharge status: Home / Self Care | Source: Ambulatory Visit | Attending: Cardiology | Admitting: Cardiology

## 2024-03-31 ENCOUNTER — Other Ambulatory Visit (HOSPITAL_COMMUNITY): Payer: Self-pay

## 2024-03-31 DIAGNOSIS — I700 Atherosclerosis of aorta: Secondary | ICD-10-CM | POA: Diagnosis not present

## 2024-03-31 DIAGNOSIS — R9389 Abnormal findings on diagnostic imaging of other specified body structures: Secondary | ICD-10-CM | POA: Insufficient documentation

## 2024-03-31 DIAGNOSIS — I7121 Aneurysm of the ascending aorta, without rupture: Secondary | ICD-10-CM | POA: Diagnosis not present

## 2024-04-08 DIAGNOSIS — R001 Bradycardia, unspecified: Secondary | ICD-10-CM | POA: Diagnosis not present

## 2024-04-08 DIAGNOSIS — I493 Ventricular premature depolarization: Secondary | ICD-10-CM | POA: Diagnosis not present

## 2024-04-16 ENCOUNTER — Encounter (HOSPITAL_BASED_OUTPATIENT_CLINIC_OR_DEPARTMENT_OTHER): Payer: Self-pay | Admitting: Family Medicine

## 2024-04-16 ENCOUNTER — Ambulatory Visit (HOSPITAL_BASED_OUTPATIENT_CLINIC_OR_DEPARTMENT_OTHER): Admitting: Family Medicine

## 2024-04-16 ENCOUNTER — Ambulatory Visit: Payer: Self-pay | Admitting: Cardiology

## 2024-04-16 VITALS — BP 156/64 | HR 48 | Temp 97.8°F | Ht 65.0 in | Wt 130.0 lb

## 2024-04-16 DIAGNOSIS — N183 Chronic kidney disease, stage 3 unspecified: Secondary | ICD-10-CM | POA: Diagnosis not present

## 2024-04-16 DIAGNOSIS — I493 Ventricular premature depolarization: Secondary | ICD-10-CM

## 2024-04-16 DIAGNOSIS — L309 Dermatitis, unspecified: Secondary | ICD-10-CM | POA: Diagnosis not present

## 2024-04-16 DIAGNOSIS — I7121 Aneurysm of the ascending aorta, without rupture: Secondary | ICD-10-CM

## 2024-04-16 DIAGNOSIS — R3915 Urgency of urination: Secondary | ICD-10-CM | POA: Diagnosis not present

## 2024-04-16 MED ORDER — TRIAMCINOLONE ACETONIDE 0.1 % EX OINT
1.0000 | TOPICAL_OINTMENT | Freq: Two times a day (BID) | CUTANEOUS | Status: AC
Start: 2024-04-16 — End: ?

## 2024-04-16 NOTE — Progress Notes (Unsigned)
 Subjective:   David [name] 1936/05/23 04/16/2024  Chief Complaint  Patient presents with   Follow-up    Patient in Rm #10 with his daughter. Patient state he's been having dizzy spell lately but he would drink water and it would make him feel better.    HPI: David [name] presents today for re-assessment and management of chronic medical conditions.  Patient is following up with PCP for chronic condition management.  He recently went to the ER on 01/30/2024 due to vertigo and dehydration.  He states he accidentally ate a THC infused ice cream from a family member and became extremely dizzy, disoriented and nauseous.  He was taken to the ER and admitted overnight due to dehydration.  His chest x-ray noted progressive aneurysmal thoracic aorta was partially artifactual.  This is managed by cardiology.  CT of the brain did not reveal any acute abnormalities, 8 mm dense calcified left parafalcine meningioma without associated mass effect and chronic left frontal sinusitis.  MRI revealed no acute abnormalities.  He was given 1 L of normal saline, meclizine , metoprolol .  UDS was positive for THC.  Patient does not use this regularly or recreationally.  His labs have stabilized at time of discharge.  He reports that he is not the best at drinking clear fluids, but daughter who accompanies him to his visit today is trying to encourage clear fluid intake to reduce risk of dehydration and near syncope.  He reports since being discharged from the hospital, he has been having some intermittent painful urination and increased urgency.  He states they used a Foley catheter and had multiple unsuccessful attempts in the ER with placement causing trauma and bleeding to the urethra.  He denies any bleeding, rash, lesion formation.  Denies discharge, burning, or acute retention.  He states he is still able to urinate, but at times it is painful.  CHRONIC KIDNEY DISEASE: David [name] presents for the  medical management of Chronic Kidney Disease.  Patient is  adhering to renal diet. Patient is not on ACE1/ARB therapy.  Patient is  avoiding NSAIDS.     Does not attend dialysis and does not perform peritoneal dialysis.   Lab Results  Component Value Date   NA 142 02/01/2024   K 3.7 02/01/2024   CO2 24 02/01/2024   GLUCOSE 65 (L) 02/01/2024   BUN 23 02/01/2024   CREATININE 1.12 02/01/2024   CALCIUM 8.4 (L) 02/01/2024   EGFR 74 12/16/2023   GFRNONAA >60 02/01/2024     PVC Management:  David [name] presents for the medical management of recurrent PVC.  He is currently managed by Dr. Madonna Large with cardiology.  Patient has a history of pulmonary embolism and currently taking Eliquis  2.5 mg twice daily.  This has been followed by pulmonology with Dr. Kara.  Patient recently saw cardiology on 03/16/2024.  Conservative management of possible thoracic aortic aneurysm was recommended versus chest CT. patient and family would like to proceed with chest CT this was ordered by cardiology.  CT chest resulted on 04/16/2024 with ascending aortic dilation at 40 mm.  Recommend follow-up CT chest without contrast in 1 year was recommended.  Pulmonary nodule was found and PCP will reach out to patient regarding these findings and proceed pending tobacco history.  Patient's current medication regimen is: Metoprolol  25 mg 3 times daily with hold parameters. Patient is  regularly keeping a check on BP at home.  Adhering to low sodium diet: Yes  Exercising Regularly: No Denies headache, dizziness, CP, SHOB, vision changes.   BP Readings from Last 3 Encounters:  04/16/24 (!) 156/64  03/16/24 (!) 162/64  02/12/24 128/60     The following portions of the patient's history were reviewed and updated as appropriate: past medical history, past surgical history, family history, social history, allergies, medications, and problem list.   Patient Active Problem List   Diagnosis Date Noted   Dizziness  01/31/2024   Weakness 01/31/2024   Hypertensive urgency 01/31/2024   History of DVT (deep vein thrombosis) 01/31/2024   History of pulmonary embolism 01/31/2024   Marijuana use 01/31/2024   GERD (gastroesophageal reflux disease) 01/31/2024   PVC (premature ventricular contraction) 12/16/2023   COPD (chronic obstructive pulmonary disease) (HCC) 08/28/2023   CKD stage 3a, GFR 45-59 ml/min (HCC) 08/28/2023   Dyspnea on exertion 09/24/2022   Aortic atherosclerosis (HCC) 09/24/2022   Diverticulosis 09/24/2022   Right inguinal hernia 09/24/2022   Pancreatic atrophy 09/24/2022   Left inguinal hernia 09/24/2022   Hiatal hernia 09/24/2022   DDD (degenerative disc disease), thoracolumbar 09/24/2022   Right bundle branch block (RBBB) on electrocardiogram (ECG) 09/24/2022   Left anterior fascicular block (LAFB) determined by electrocardiography 09/24/2022   Past Medical History:  Diagnosis Date   Acute deep vein thrombosis (DVT) of proximal vein of left lower extremity (HCC) 08/28/2023   Acute pulmonary embolism  with L DVT 08/27/2023   Onset ? But dx made 08/27/23 with extensive L DVT by venous dopplers   -  RV strain on CTa 08/27/23 with Echo 08/29/23  ef 40% / lots of pvc's/ no elevation PAS or RV abnormalities and walked halls/ did steps prior to d/c on Eliquis    - 09/13/2023 reports sob across the room at home since d/c but mb and back ok since d/c though LLE swelling gradually improving   - 09/13/2023   Walked on RA   x  2  lap   Bell's palsy    COPD (chronic obstructive pulmonary disease) (HCC) 08/28/2023   Kidney stones    UTI (urinary tract infection)    Past Surgical History:  Procedure Laterality Date   TONSILLECTOMY     Family History  Problem Relation Age of Onset   Heart disease Brother    Liver disease Neg Hx    Esophageal cancer Neg Hx    Colon cancer Neg Hx    Outpatient Medications Prior to Visit  Medication Sig Dispense Refill   apixaban  (ELIQUIS ) 2.5 MG TABS tablet  Take 1 tablet (2.5 mg total) by mouth 2 (two) times daily. 60 tablet 11   diphenhydrAMINE  HCl (BENADRYL  ALLERGY PO) Take 1 tablet by mouth at bedtime as needed (itching).     furosemide  (LASIX ) 20 MG tablet Take 1 tablet (20 mg total) by mouth daily. 90 tablet 3   metoprolol  tartrate (LOPRESSOR ) 25 MG tablet Take 1 tablet (25 mg total) by mouth 3 (three) times daily. HOLD if systolic blood pressure (top number) is less than 100 and/or if heart rate less than 55 180 tablet 3   omeprazole  (PRILOSEC) 20 MG capsule Take 1 capsule (20 mg total) by mouth 2 (two) times daily before a meal. (Patient taking differently: Take 20 mg by mouth as needed (heartburn and GERD).) 180 capsule 1   No facility-administered medications prior to visit.   No Known Allergies   ROS: A complete ROS was performed with pertinent positives/negatives noted in the HPI. The remainder of the ROS are negative.  Objective:   Today's Vitals   04/16/24 1306 04/16/24 1338  BP: (!) 166/54 (!) 156/64  Pulse: (!) 48   Temp: 97.8 F (36.6 C)   SpO2: 98%   Weight: 130 lb (59 kg)   Height: 5' 5 (1.651 m)     Physical Exam   GENERAL: Well-appearing, in NAD. Well nourished.  SKIN: Pink, warm and dry.  Head: Normocephalic. NECK: Trachea midline. Full ROM w/o pain or tenderness.  RESPIRATORY: Chest wall symmetrical. Respirations even and non-labored. Breath sounds clear to auscultation bilaterally.  CARDIAC: S1, S2 present, regular rate and rhythm without murmur or gallops. Peripheral pulses 2+ bilaterally.  MSK: Muscle tone and strength appropriate for age. GI:  No CVA tenderness.  EXTREMITIES: Without clubbing, cyanosis, or edema.  NEUROLOGIC: No motor or sensory deficits. Steady, even gait. C2-C12 intact.  PSYCH/MENTAL STATUS: Alert, oriented x 3. Cooperative, appropriate mood and affect.      Results for orders placed or performed in visit on 04/16/24  POCT URINALYSIS DIP (CLINITEK)   Collection Time: 04/17/24  10:00 AM  Result Value Ref Range   Color, UA yellow yellow   Clarity, UA clear clear   Glucose, UA negative negative mg/dL   Bilirubin, UA small (A) negative   Ketones, POC UA negative negative mg/dL   Spec Grav, UA >=8.969 (A) 1.010 - 1.025   Blood, UA negative negative   pH, UA 6.0 5.0 - 8.0   POC PROTEIN,UA =30 (A) negative, trace   Urobilinogen, UA 0.2 0.2 or 1.0 E.U./dL   Nitrite, UA Negative Negative   Leukocytes, UA Negative Negative    Assessment & Plan:  1. Dermatitis Stable per patient. Continue on current regimen.  - triamcinolone  ointment (KENALOG ) 0.1 %; Apply 1 Application topically 2 (two) times daily to affected areas.  2. Urinary urgency (Primary) Recommend starting medication for possible BPH related urinary urgency and discussed multiple classes of medications and possible side effects.  Urinalysis was negative for any signs of infection.  Patient declined starting medication and states he will return to PCP if urgency worsens or does not improve.  We discussed red flag signs and symptoms with him and family member and they verbalized understanding. - POCT URINALYSIS DIP (CLINITEK)  3. Stage 3 chronic kidney disease, unspecified whether stage 3a or 3b CKD (HCC) Stable per recent BMP.  Recommend repeat with follow-up in 3 to 4 months.  Continue to avoid NSAIDs, discussed increasing clear hydration daily to support healthy renal function.  4. PVC (premature ventricular contraction) Stable currently per patient.  Will continue with cardiology management.  5. Aneurysm of ascending aorta without rupture [facility]) Currently managed by cardiology.  Will reach out to patient regarding CT results as these resulted following the end of this visit.   Meds ordered this encounter  Medications   triamcinolone  ointment (KENALOG ) 0.1 %    Sig: Apply 1 Application topically 2 (two) times daily to affected areas.    Supervising Provider:   DE PERU, RAYMOND J [[id number]]   Lab  Orders         POCT URINALYSIS DIP (CLINITEK)     No images are attached to the encounter or orders placed in the encounter.  Return in about 4 months (around 08/16/2024) for ANNUAL PHYSICAL, Chronic Follow up .    Patient to reach out to office if new, worrisome, or unresolved symptoms arise or if no improvement in patient's condition. Patient verbalized understanding and is agreeable to treatment plan. All questions answered  to patient's satisfaction.    David [name], [place]

## 2024-04-17 ENCOUNTER — Encounter (HOSPITAL_BASED_OUTPATIENT_CLINIC_OR_DEPARTMENT_OTHER): Payer: Self-pay | Admitting: Family Medicine

## 2024-04-17 DIAGNOSIS — I7121 Aneurysm of the ascending aorta, without rupture: Secondary | ICD-10-CM | POA: Insufficient documentation

## 2024-04-17 LAB — POCT URINALYSIS DIP (CLINITEK)
Blood, UA: NEGATIVE
Glucose, UA: NEGATIVE mg/dL
Ketones, POC UA: NEGATIVE mg/dL
Leukocytes, UA: NEGATIVE
Nitrite, UA: NEGATIVE
POC PROTEIN,UA: 30 — AB
Spec Grav, UA: >=1.03 — AB (ref 1.010–1.025)
Urobilinogen, UA: 0.2 U/dL
pH, UA: 6 (ref 5.0–8.0)

## 2024-04-21 ENCOUNTER — Encounter (HOSPITAL_BASED_OUTPATIENT_CLINIC_OR_DEPARTMENT_OTHER): Payer: Self-pay | Admitting: Family Medicine

## 2024-04-23 ENCOUNTER — Other Ambulatory Visit (HOSPITAL_BASED_OUTPATIENT_CLINIC_OR_DEPARTMENT_OTHER): Payer: Self-pay

## 2024-05-03 DIAGNOSIS — R001 Bradycardia, unspecified: Secondary | ICD-10-CM

## 2024-05-03 DIAGNOSIS — I493 Ventricular premature depolarization: Secondary | ICD-10-CM | POA: Diagnosis not present

## 2024-05-14 NOTE — Progress Notes (Signed)
 Called patient, daughter answered (Per DPR) gave her patient monitor results. Patient daughter verbalized understands. All question or if any were answered.

## 2024-05-27 ENCOUNTER — Other Ambulatory Visit: Payer: Self-pay

## 2024-05-27 ENCOUNTER — Other Ambulatory Visit (HOSPITAL_COMMUNITY): Payer: Self-pay

## 2024-05-27 ENCOUNTER — Other Ambulatory Visit (HOSPITAL_BASED_OUTPATIENT_CLINIC_OR_DEPARTMENT_OTHER): Payer: Self-pay | Admitting: Family Medicine

## 2024-05-27 DIAGNOSIS — L309 Dermatitis, unspecified: Secondary | ICD-10-CM

## 2024-05-27 MED ORDER — TRIAMCINOLONE ACETONIDE 0.1 % EX OINT
1.0000 | TOPICAL_OINTMENT | Freq: Two times a day (BID) | CUTANEOUS | 6 refills | Status: AC
  Filled 2024-05-27 (×2): qty 60, 30d supply, fill #0
  Filled 2024-07-14: qty 60, 30d supply, fill #1
  Filled 2024-08-21: qty 60, 30d supply, fill #2

## 2024-05-28 ENCOUNTER — Encounter: Payer: Self-pay | Admitting: Pharmacist

## 2024-05-28 ENCOUNTER — Other Ambulatory Visit: Payer: Self-pay

## 2024-05-28 ENCOUNTER — Other Ambulatory Visit (HOSPITAL_COMMUNITY): Payer: Self-pay

## 2024-07-15 ENCOUNTER — Other Ambulatory Visit (HOSPITAL_COMMUNITY): Payer: Self-pay

## 2024-08-05 ENCOUNTER — Encounter: Payer: Self-pay | Admitting: Cardiology

## 2024-08-21 ENCOUNTER — Other Ambulatory Visit (HOSPITAL_COMMUNITY): Payer: Self-pay

## 2024-09-01 ENCOUNTER — Encounter (HOSPITAL_BASED_OUTPATIENT_CLINIC_OR_DEPARTMENT_OTHER): Admitting: Family Medicine

## 2024-09-16 ENCOUNTER — Encounter (HOSPITAL_BASED_OUTPATIENT_CLINIC_OR_DEPARTMENT_OTHER): Admitting: Family Medicine

## 2024-11-23 ENCOUNTER — Encounter (HOSPITAL_BASED_OUTPATIENT_CLINIC_OR_DEPARTMENT_OTHER): Admitting: Family Medicine
